# Patient Record
Sex: Female | Born: 1940 | Race: Asian | Hispanic: No | Marital: Married | Smoking: Never smoker
Health system: Southern US, Community
[De-identification: ages and names within clinical notes are randomized; demographics above are authoritative.]

## PROBLEM LIST (undated history)

## (undated) DIAGNOSIS — E785 Hyperlipidemia, unspecified: Secondary | ICD-10-CM

## (undated) DIAGNOSIS — D72819 Decreased white blood cell count, unspecified: Secondary | ICD-10-CM

## (undated) DIAGNOSIS — Z923 Personal history of irradiation: Secondary | ICD-10-CM

## (undated) DIAGNOSIS — I10 Essential (primary) hypertension: Secondary | ICD-10-CM

## (undated) DIAGNOSIS — C50919 Malignant neoplasm of unspecified site of unspecified female breast: Secondary | ICD-10-CM

## (undated) DIAGNOSIS — Z9221 Personal history of antineoplastic chemotherapy: Secondary | ICD-10-CM

## (undated) DIAGNOSIS — F419 Anxiety disorder, unspecified: Secondary | ICD-10-CM

## (undated) HISTORY — PX: BREAST SURGERY: SHX581

## (undated) HISTORY — DX: Malignant neoplasm of unspecified site of unspecified female breast: C50.919

## (undated) HISTORY — DX: Hyperlipidemia, unspecified: E78.5

## (undated) HISTORY — DX: Essential (primary) hypertension: I10

## (undated) HISTORY — DX: Decreased white blood cell count, unspecified: D72.819

## (undated) HISTORY — PX: BREAST BIOPSY: SHX20

## (undated) HISTORY — PX: BREAST EXCISIONAL BIOPSY: SUR124

## (undated) HISTORY — DX: Anxiety disorder, unspecified: F41.9

---

## 1967-08-27 HISTORY — PX: APPENDECTOMY: SHX54

## 1976-06-26 HISTORY — PX: ABDOMINAL HYSTERECTOMY: SHX81

## 1987-08-27 HISTORY — PX: LIPOMA EXCISION: SHX5283

## 1999-01-09 ENCOUNTER — Emergency Department (HOSPITAL_COMMUNITY): Admission: EM | Admit: 1999-01-09 | Discharge: 1999-01-09 | Payer: Self-pay | Admitting: Emergency Medicine

## 1999-08-07 ENCOUNTER — Encounter: Payer: Self-pay | Admitting: Family Medicine

## 1999-08-07 ENCOUNTER — Encounter: Admission: RE | Admit: 1999-08-07 | Discharge: 1999-08-07 | Payer: Self-pay | Admitting: Family Medicine

## 2000-08-07 ENCOUNTER — Encounter: Payer: Self-pay | Admitting: Family Medicine

## 2000-08-07 ENCOUNTER — Encounter: Admission: RE | Admit: 2000-08-07 | Discharge: 2000-08-07 | Payer: Self-pay | Admitting: Family Medicine

## 2000-10-27 ENCOUNTER — Emergency Department (HOSPITAL_COMMUNITY): Admission: EM | Admit: 2000-10-27 | Discharge: 2000-10-27 | Payer: Self-pay | Admitting: Emergency Medicine

## 2001-08-10 ENCOUNTER — Encounter: Admission: RE | Admit: 2001-08-10 | Discharge: 2001-08-10 | Payer: Self-pay | Admitting: Family Medicine

## 2001-08-10 ENCOUNTER — Encounter: Payer: Self-pay | Admitting: Family Medicine

## 2001-08-12 ENCOUNTER — Encounter: Payer: Self-pay | Admitting: Family Medicine

## 2001-08-12 ENCOUNTER — Encounter: Admission: RE | Admit: 2001-08-12 | Discharge: 2001-08-12 | Payer: Self-pay | Admitting: Family Medicine

## 2002-08-13 ENCOUNTER — Encounter: Payer: Self-pay | Admitting: Family Medicine

## 2002-08-13 ENCOUNTER — Encounter: Admission: RE | Admit: 2002-08-13 | Discharge: 2002-08-13 | Payer: Self-pay | Admitting: Family Medicine

## 2002-08-27 ENCOUNTER — Encounter: Payer: Self-pay | Admitting: Family Medicine

## 2002-08-27 ENCOUNTER — Encounter: Admission: RE | Admit: 2002-08-27 | Discharge: 2002-08-27 | Payer: Self-pay | Admitting: Family Medicine

## 2002-08-27 ENCOUNTER — Other Ambulatory Visit: Admission: RE | Admit: 2002-08-27 | Discharge: 2002-08-27 | Payer: Self-pay | Admitting: Diagnostic Radiology

## 2002-08-27 ENCOUNTER — Encounter (INDEPENDENT_AMBULATORY_CARE_PROVIDER_SITE_OTHER): Payer: Self-pay | Admitting: Specialist

## 2002-09-01 ENCOUNTER — Encounter: Payer: Self-pay | Admitting: General Surgery

## 2002-09-01 ENCOUNTER — Encounter: Admission: RE | Admit: 2002-09-01 | Discharge: 2002-09-01 | Payer: Self-pay | Admitting: General Surgery

## 2002-09-02 ENCOUNTER — Encounter: Payer: Self-pay | Admitting: General Surgery

## 2002-09-02 ENCOUNTER — Encounter (INDEPENDENT_AMBULATORY_CARE_PROVIDER_SITE_OTHER): Payer: Self-pay | Admitting: *Deleted

## 2002-09-02 ENCOUNTER — Encounter: Admission: RE | Admit: 2002-09-02 | Discharge: 2002-09-02 | Payer: Self-pay | Admitting: General Surgery

## 2002-09-02 ENCOUNTER — Ambulatory Visit (HOSPITAL_BASED_OUTPATIENT_CLINIC_OR_DEPARTMENT_OTHER): Admission: RE | Admit: 2002-09-02 | Discharge: 2002-09-02 | Payer: Self-pay | Admitting: General Surgery

## 2002-09-02 DIAGNOSIS — C50919 Malignant neoplasm of unspecified site of unspecified female breast: Secondary | ICD-10-CM

## 2002-09-02 HISTORY — PX: BREAST LUMPECTOMY: SHX2

## 2002-09-02 HISTORY — DX: Malignant neoplasm of unspecified site of unspecified female breast: C50.919

## 2002-09-22 ENCOUNTER — Ambulatory Visit: Admission: RE | Admit: 2002-09-22 | Discharge: 2002-10-12 | Payer: Self-pay | Admitting: Radiation Oncology

## 2002-09-27 ENCOUNTER — Encounter: Payer: Self-pay | Admitting: Oncology

## 2002-09-27 ENCOUNTER — Ambulatory Visit (HOSPITAL_COMMUNITY): Admission: RE | Admit: 2002-09-27 | Discharge: 2002-09-27 | Payer: Self-pay | Admitting: Oncology

## 2002-09-30 ENCOUNTER — Ambulatory Visit (HOSPITAL_COMMUNITY): Admission: RE | Admit: 2002-09-30 | Discharge: 2002-09-30 | Payer: Self-pay | Admitting: Oncology

## 2002-09-30 ENCOUNTER — Encounter: Payer: Self-pay | Admitting: Oncology

## 2002-10-04 ENCOUNTER — Other Ambulatory Visit: Admission: RE | Admit: 2002-10-04 | Discharge: 2002-10-04 | Payer: Self-pay | Admitting: Oncology

## 2002-10-04 ENCOUNTER — Encounter (INDEPENDENT_AMBULATORY_CARE_PROVIDER_SITE_OTHER): Payer: Self-pay | Admitting: Specialist

## 2002-10-19 ENCOUNTER — Encounter: Admission: RE | Admit: 2002-10-19 | Discharge: 2002-10-19 | Payer: Self-pay | Admitting: Radiation Oncology

## 2002-11-02 ENCOUNTER — Encounter: Payer: Self-pay | Admitting: Oncology

## 2002-11-02 ENCOUNTER — Ambulatory Visit (HOSPITAL_COMMUNITY): Admission: RE | Admit: 2002-11-02 | Discharge: 2002-11-02 | Payer: Self-pay | Admitting: Oncology

## 2002-12-19 ENCOUNTER — Emergency Department (HOSPITAL_COMMUNITY): Admission: EM | Admit: 2002-12-19 | Discharge: 2002-12-19 | Payer: Self-pay | Admitting: Emergency Medicine

## 2002-12-25 ENCOUNTER — Ambulatory Visit (HOSPITAL_COMMUNITY): Admission: RE | Admit: 2002-12-25 | Discharge: 2002-12-25 | Payer: Self-pay | Admitting: Oncology

## 2002-12-25 ENCOUNTER — Encounter: Payer: Self-pay | Admitting: Oncology

## 2003-01-03 ENCOUNTER — Ambulatory Visit (HOSPITAL_COMMUNITY): Admission: RE | Admit: 2003-01-03 | Discharge: 2003-01-03 | Payer: Self-pay | Admitting: Oncology

## 2003-01-03 ENCOUNTER — Encounter: Payer: Self-pay | Admitting: Oncology

## 2003-02-23 ENCOUNTER — Ambulatory Visit: Admission: RE | Admit: 2003-02-23 | Discharge: 2003-04-19 | Payer: Self-pay | Admitting: Radiation Oncology

## 2003-04-24 ENCOUNTER — Encounter: Payer: Self-pay | Admitting: Emergency Medicine

## 2003-04-24 ENCOUNTER — Emergency Department (HOSPITAL_COMMUNITY): Admission: EM | Admit: 2003-04-24 | Discharge: 2003-04-24 | Payer: Self-pay | Admitting: Emergency Medicine

## 2003-06-21 ENCOUNTER — Encounter: Admission: RE | Admit: 2003-06-21 | Discharge: 2003-06-21 | Payer: Self-pay | Admitting: Oncology

## 2003-08-30 ENCOUNTER — Encounter: Admission: RE | Admit: 2003-08-30 | Discharge: 2003-08-30 | Payer: Self-pay | Admitting: General Surgery

## 2004-04-16 ENCOUNTER — Ambulatory Visit (HOSPITAL_COMMUNITY): Admission: RE | Admit: 2004-04-16 | Discharge: 2004-04-16 | Payer: Self-pay | Admitting: Gastroenterology

## 2004-08-30 ENCOUNTER — Encounter: Admission: RE | Admit: 2004-08-30 | Discharge: 2004-08-30 | Payer: Self-pay | Admitting: General Surgery

## 2004-10-04 ENCOUNTER — Ambulatory Visit: Payer: Self-pay | Admitting: Oncology

## 2005-04-05 ENCOUNTER — Ambulatory Visit: Payer: Self-pay | Admitting: Oncology

## 2005-06-10 ENCOUNTER — Encounter: Admission: RE | Admit: 2005-06-10 | Discharge: 2005-06-10 | Payer: Self-pay | Admitting: Oncology

## 2005-09-02 ENCOUNTER — Encounter: Admission: RE | Admit: 2005-09-02 | Discharge: 2005-09-02 | Payer: Self-pay | Admitting: Oncology

## 2005-09-12 ENCOUNTER — Encounter: Admission: RE | Admit: 2005-09-12 | Discharge: 2005-09-12 | Payer: Self-pay | Admitting: General Surgery

## 2005-09-12 ENCOUNTER — Encounter (INDEPENDENT_AMBULATORY_CARE_PROVIDER_SITE_OTHER): Payer: Self-pay | Admitting: Specialist

## 2005-09-12 ENCOUNTER — Ambulatory Visit (HOSPITAL_BASED_OUTPATIENT_CLINIC_OR_DEPARTMENT_OTHER): Admission: RE | Admit: 2005-09-12 | Discharge: 2005-09-12 | Payer: Self-pay | Admitting: General Surgery

## 2005-10-04 ENCOUNTER — Ambulatory Visit: Payer: Self-pay | Admitting: Oncology

## 2006-03-27 ENCOUNTER — Ambulatory Visit: Payer: Self-pay | Admitting: Oncology

## 2006-03-31 LAB — CBC WITH DIFFERENTIAL/PLATELET
BASO%: 0.6 % (ref 0.0–2.0)
Basophils Absolute: 0 10*3/uL (ref 0.0–0.1)
EOS%: 1.8 % (ref 0.0–7.0)
HCT: 33.7 % — ABNORMAL LOW (ref 34.8–46.6)
HGB: 11.9 g/dL (ref 11.6–15.9)
MCH: 32.5 pg (ref 26.0–34.0)
MONO#: 0.3 10*3/uL (ref 0.1–0.9)
RDW: 13.8 % (ref 11.3–14.5)
WBC: 3.5 10*3/uL — ABNORMAL LOW (ref 3.9–10.0)
lymph#: 1 10*3/uL (ref 0.9–3.3)

## 2006-03-31 LAB — COMPREHENSIVE METABOLIC PANEL
ALT: 38 U/L (ref 0–40)
AST: 32 U/L (ref 0–37)
Albumin: 4.3 g/dL (ref 3.5–5.2)
Alkaline Phosphatase: 35 U/L — ABNORMAL LOW (ref 39–117)
Potassium: 4.4 mEq/L (ref 3.5–5.3)
Sodium: 131 mEq/L — ABNORMAL LOW (ref 135–145)
Total Bilirubin: 0.4 mg/dL (ref 0.3–1.2)
Total Protein: 6.6 g/dL (ref 6.0–8.3)

## 2006-03-31 LAB — LACTATE DEHYDROGENASE: LDH: 152 U/L (ref 94–250)

## 2006-04-15 LAB — BASIC METABOLIC PANEL
BUN: 12 mg/dL (ref 6–23)
CO2: 30 mEq/L (ref 19–32)
Chloride: 94 mEq/L — ABNORMAL LOW (ref 96–112)
Creatinine, Ser: 0.78 mg/dL (ref 0.40–1.20)
Glucose, Bld: 99 mg/dL (ref 70–99)

## 2006-09-03 ENCOUNTER — Ambulatory Visit (HOSPITAL_COMMUNITY): Admission: RE | Admit: 2006-09-03 | Discharge: 2006-09-03 | Payer: Self-pay | Admitting: Oncology

## 2006-09-03 ENCOUNTER — Encounter: Admission: RE | Admit: 2006-09-03 | Discharge: 2006-09-03 | Payer: Self-pay | Admitting: Oncology

## 2006-09-08 ENCOUNTER — Encounter: Admission: RE | Admit: 2006-09-08 | Discharge: 2006-09-08 | Payer: Self-pay | Admitting: Oncology

## 2006-09-24 ENCOUNTER — Ambulatory Visit: Payer: Self-pay | Admitting: Oncology

## 2006-09-29 LAB — CBC WITH DIFFERENTIAL/PLATELET
BASO%: 0.5 % (ref 0.0–2.0)
Basophils Absolute: 0 10*3/uL (ref 0.0–0.1)
EOS%: 2.2 % (ref 0.0–7.0)
HGB: 12.3 g/dL (ref 11.6–15.9)
MCH: 32.2 pg (ref 26.0–34.0)
MCHC: 35.5 g/dL (ref 32.0–36.0)
MCV: 90.8 fL (ref 81.0–101.0)
MONO%: 11.2 % (ref 0.0–13.0)
RDW: 13.8 % (ref 11.3–14.5)

## 2006-09-29 LAB — COMPREHENSIVE METABOLIC PANEL
ALT: 39 U/L — ABNORMAL HIGH (ref 0–35)
AST: 35 U/L (ref 0–37)
Albumin: 4.2 g/dL (ref 3.5–5.2)
Alkaline Phosphatase: 35 U/L — ABNORMAL LOW (ref 39–117)
BUN: 18 mg/dL (ref 6–23)
Creatinine, Ser: 0.83 mg/dL (ref 0.40–1.20)
Potassium: 4.1 mEq/L (ref 3.5–5.3)

## 2006-09-29 LAB — LACTATE DEHYDROGENASE: LDH: 142 U/L (ref 94–250)

## 2007-03-09 ENCOUNTER — Ambulatory Visit (HOSPITAL_COMMUNITY): Admission: RE | Admit: 2007-03-09 | Discharge: 2007-03-09 | Payer: Self-pay | Admitting: Oncology

## 2007-03-11 ENCOUNTER — Encounter: Admission: RE | Admit: 2007-03-11 | Discharge: 2007-03-11 | Payer: Self-pay | Admitting: Oncology

## 2007-03-26 ENCOUNTER — Ambulatory Visit: Payer: Self-pay | Admitting: Oncology

## 2007-03-30 LAB — CBC WITH DIFFERENTIAL/PLATELET
Basophils Absolute: 0 10*3/uL (ref 0.0–0.1)
Eosinophils Absolute: 0 10*3/uL (ref 0.0–0.5)
HGB: 12.4 g/dL (ref 11.6–15.9)
MCV: 90.9 fL (ref 81.0–101.0)
MONO#: 0.4 10*3/uL (ref 0.1–0.9)
NEUT#: 2 10*3/uL (ref 1.5–6.5)
RBC: 3.86 10*6/uL (ref 3.70–5.32)
RDW: 13.8 % (ref 11.3–14.5)
WBC: 4 10*3/uL (ref 3.9–10.0)

## 2007-03-30 LAB — COMPREHENSIVE METABOLIC PANEL
Albumin: 4.3 g/dL (ref 3.5–5.2)
BUN: 18 mg/dL (ref 6–23)
CO2: 26 mEq/L (ref 19–32)
Calcium: 9.2 mg/dL (ref 8.4–10.5)
Chloride: 99 mEq/L (ref 96–112)
Glucose, Bld: 82 mg/dL (ref 70–99)
Potassium: 4.5 mEq/L (ref 3.5–5.3)
Sodium: 135 mEq/L (ref 135–145)
Total Protein: 7 g/dL (ref 6.0–8.3)

## 2007-03-30 LAB — LACTATE DEHYDROGENASE: LDH: 159 U/L (ref 94–250)

## 2007-03-30 LAB — CANCER ANTIGEN 27.29: CA 27.29: 5 U/mL (ref 0–39)

## 2007-06-12 ENCOUNTER — Encounter: Admission: RE | Admit: 2007-06-12 | Discharge: 2007-06-12 | Payer: Self-pay | Admitting: Oncology

## 2007-09-07 ENCOUNTER — Encounter: Admission: RE | Admit: 2007-09-07 | Discharge: 2007-09-07 | Payer: Self-pay | Admitting: Oncology

## 2007-09-28 ENCOUNTER — Ambulatory Visit: Payer: Self-pay | Admitting: Oncology

## 2007-09-30 LAB — CBC WITH DIFFERENTIAL/PLATELET
BASO%: 0.7 % (ref 0.0–2.0)
Eosinophils Absolute: 0.1 10*3/uL (ref 0.0–0.5)
HCT: 33.1 % — ABNORMAL LOW (ref 34.8–46.6)
LYMPH%: 32.2 % (ref 14.0–48.0)
MONO#: 0.3 10*3/uL (ref 0.1–0.9)
NEUT#: 1.6 10*3/uL (ref 1.5–6.5)
NEUT%: 55.3 % (ref 39.6–76.8)
Platelets: 176 10*3/uL (ref 145–400)
WBC: 3 10*3/uL — ABNORMAL LOW (ref 3.9–10.0)
lymph#: 1 10*3/uL (ref 0.9–3.3)

## 2007-10-01 LAB — COMPREHENSIVE METABOLIC PANEL
ALT: 30 U/L (ref 0–35)
CO2: 28 mEq/L (ref 19–32)
Calcium: 8.7 mg/dL (ref 8.4–10.5)
Chloride: 98 mEq/L (ref 96–112)
Creatinine, Ser: 0.75 mg/dL (ref 0.40–1.20)
Glucose, Bld: 108 mg/dL — ABNORMAL HIGH (ref 70–99)
Sodium: 133 mEq/L — ABNORMAL LOW (ref 135–145)
Total Bilirubin: 0.3 mg/dL (ref 0.3–1.2)
Total Protein: 6.6 g/dL (ref 6.0–8.3)

## 2007-10-01 LAB — VITAMIN D 25 HYDROXY (VIT D DEFICIENCY, FRACTURES): Vit D, 25-Hydroxy: 40 ng/mL (ref 30–89)

## 2007-10-05 LAB — VITAMIN D 1,25 DIHYDROXY: Vit D, 1,25-Dihydroxy: 61 pg/mL (ref 6–62)

## 2008-03-26 ENCOUNTER — Ambulatory Visit: Payer: Self-pay | Admitting: Oncology

## 2008-03-29 LAB — CBC WITH DIFFERENTIAL/PLATELET
Basophils Absolute: 0 10*3/uL (ref 0.0–0.1)
EOS%: 1 % (ref 0.0–7.0)
Eosinophils Absolute: 0 10*3/uL (ref 0.0–0.5)
HGB: 11.6 g/dL (ref 11.6–15.9)
NEUT#: 2.4 10*3/uL (ref 1.5–6.5)
RBC: 3.62 10*6/uL — ABNORMAL LOW (ref 3.70–5.32)
RDW: 13.4 % (ref 11.3–14.5)
lymph#: 1.1 10*3/uL (ref 0.9–3.3)

## 2008-03-30 LAB — COMPREHENSIVE METABOLIC PANEL
ALT: 25 U/L (ref 0–35)
AST: 30 U/L (ref 0–37)
Alkaline Phosphatase: 33 U/L — ABNORMAL LOW (ref 39–117)
BUN: 18 mg/dL (ref 6–23)
Creatinine, Ser: 0.83 mg/dL (ref 0.40–1.20)
Total Bilirubin: 0.4 mg/dL (ref 0.3–1.2)

## 2008-03-30 LAB — VITAMIN D 25 HYDROXY (VIT D DEFICIENCY, FRACTURES): Vit D, 25-Hydroxy: 39 ng/mL (ref 30–89)

## 2008-09-07 ENCOUNTER — Encounter: Admission: RE | Admit: 2008-09-07 | Discharge: 2008-09-07 | Payer: Self-pay | Admitting: Oncology

## 2008-10-25 ENCOUNTER — Ambulatory Visit: Payer: Self-pay | Admitting: Oncology

## 2008-10-27 LAB — CBC WITH DIFFERENTIAL/PLATELET
Basophils Absolute: 0 10*3/uL (ref 0.0–0.1)
EOS%: 1.3 % (ref 0.0–7.0)
Eosinophils Absolute: 0 10*3/uL (ref 0.0–0.5)
HGB: 11.9 g/dL (ref 11.6–15.9)
MCH: 31.9 pg (ref 25.1–34.0)
MONO%: 6.4 % (ref 0.0–14.0)
NEUT#: 2.4 10*3/uL (ref 1.5–6.5)
RBC: 3.72 10*6/uL (ref 3.70–5.45)
RDW: 13.6 % (ref 11.2–14.5)
lymph#: 1.2 10*3/uL (ref 0.9–3.3)

## 2008-10-28 LAB — COMPREHENSIVE METABOLIC PANEL
Alkaline Phosphatase: 33 U/L — ABNORMAL LOW (ref 39–117)
BUN: 16 mg/dL (ref 6–23)
Creatinine, Ser: 0.75 mg/dL (ref 0.40–1.20)
Glucose, Bld: 110 mg/dL — ABNORMAL HIGH (ref 70–99)
Total Bilirubin: 0.3 mg/dL (ref 0.3–1.2)

## 2008-10-28 LAB — VITAMIN D 25 HYDROXY (VIT D DEFICIENCY, FRACTURES): Vit D, 25-Hydroxy: 48 ng/mL (ref 30–89)

## 2008-10-28 LAB — CANCER ANTIGEN 27.29: CA 27.29: 13 U/mL (ref 0–39)

## 2009-04-28 ENCOUNTER — Ambulatory Visit: Payer: Self-pay | Admitting: Oncology

## 2009-05-03 LAB — CBC WITH DIFFERENTIAL/PLATELET
Basophils Absolute: 0 10*3/uL (ref 0.0–0.1)
Eosinophils Absolute: 0 10*3/uL (ref 0.0–0.5)
HCT: 33.2 % — ABNORMAL LOW (ref 34.8–46.6)
HGB: 11.6 g/dL (ref 11.6–15.9)
LYMPH%: 20.8 % (ref 14.0–49.7)
MCV: 91.5 fL (ref 79.5–101.0)
MONO%: 5.9 % (ref 0.0–14.0)
NEUT#: 2.8 10*3/uL (ref 1.5–6.5)
NEUT%: 71.6 % (ref 38.4–76.8)
Platelets: 155 10*3/uL (ref 145–400)

## 2009-05-04 LAB — COMPREHENSIVE METABOLIC PANEL
Alkaline Phosphatase: 33 U/L — ABNORMAL LOW (ref 39–117)
BUN: 15 mg/dL (ref 6–23)
CO2: 28 mEq/L (ref 19–32)
Glucose, Bld: 182 mg/dL — ABNORMAL HIGH (ref 70–99)
Total Bilirubin: 0.4 mg/dL (ref 0.3–1.2)

## 2009-05-04 LAB — VITAMIN D 25 HYDROXY (VIT D DEFICIENCY, FRACTURES): Vit D, 25-Hydroxy: 45 ng/mL (ref 30–89)

## 2009-05-04 LAB — LACTATE DEHYDROGENASE: LDH: 161 U/L (ref 94–250)

## 2009-05-04 LAB — CANCER ANTIGEN 27.29: CA 27.29: 7 U/mL (ref 0–39)

## 2009-06-03 IMAGING — MG MM DIAGNOSTIC BILATERAL
7 series · 7 of 7 positions shown · non-contrast
Comparison: Prior studies.

DG DIAGNOSTIC BILATERAL
Bilateral CC and MLO view(s) were taken.

DIGITAL BILATERAL  DIAGNOSTIC MAMMOGRAM WITH MAMMOGRAM:
CLINICAL DATA: Left breast lumpectomy in 0888.  Reevaluation of the lumpectomy site as well as 
probably benign right breast calcifications.

[R CC (1 of 2)]
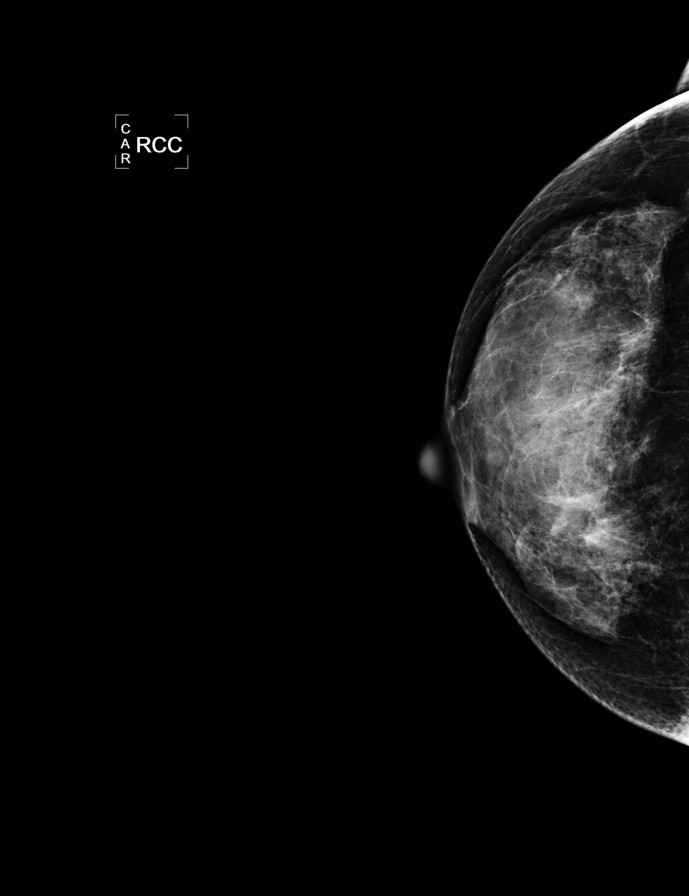

[L CC]
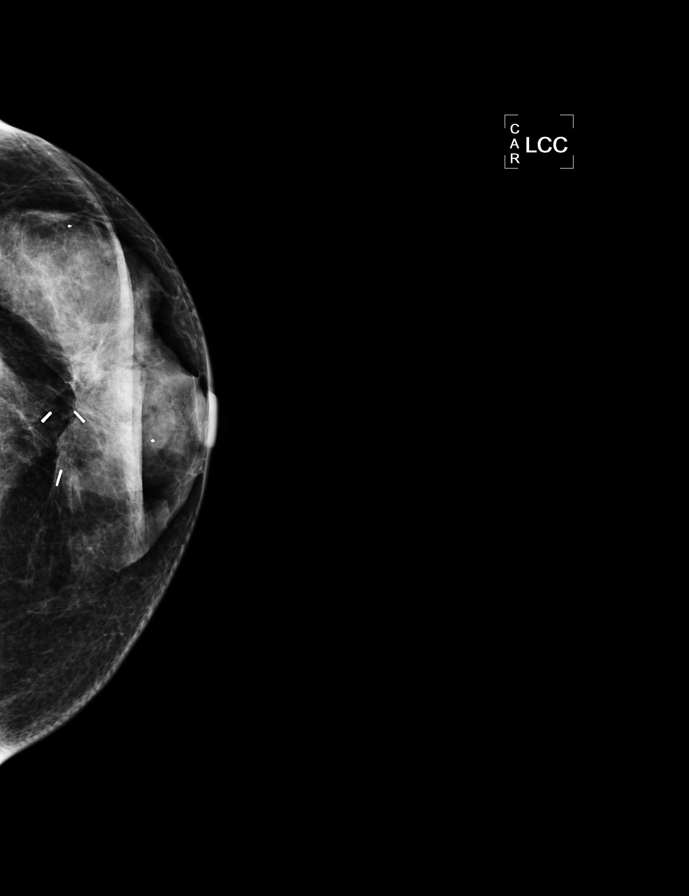

[L MLO]
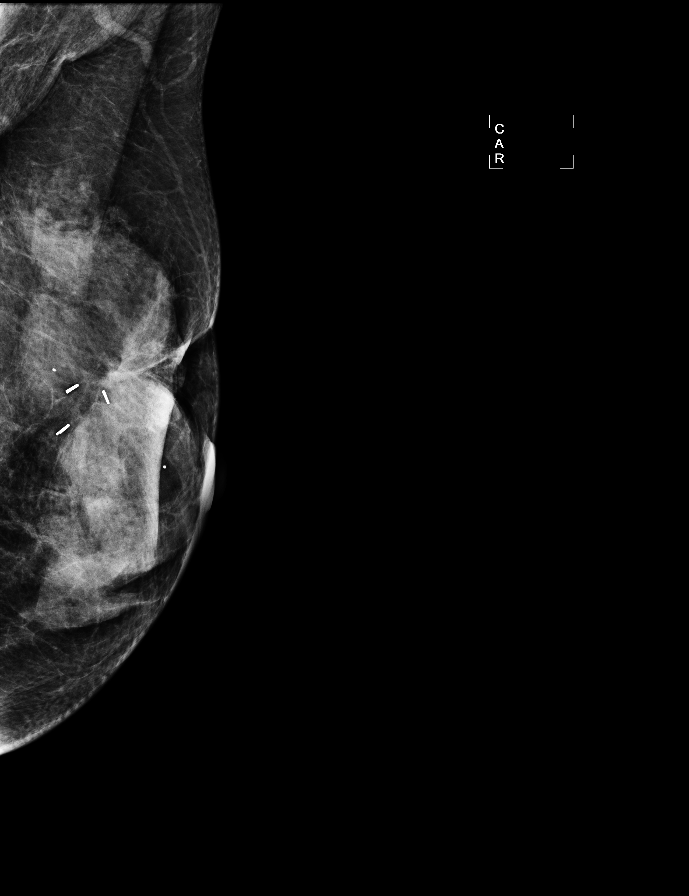

[R MLO]
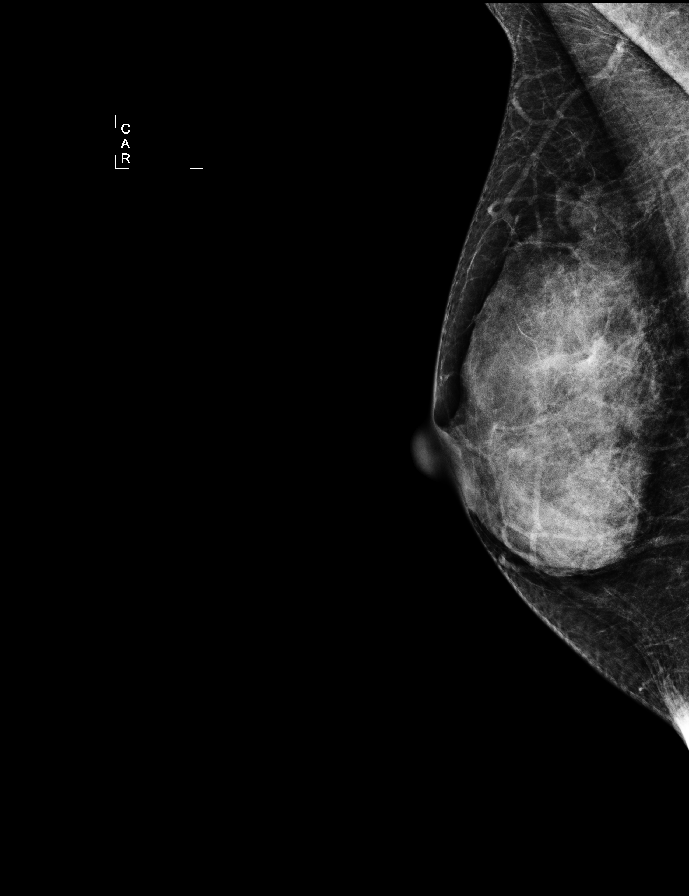

[L TAN]
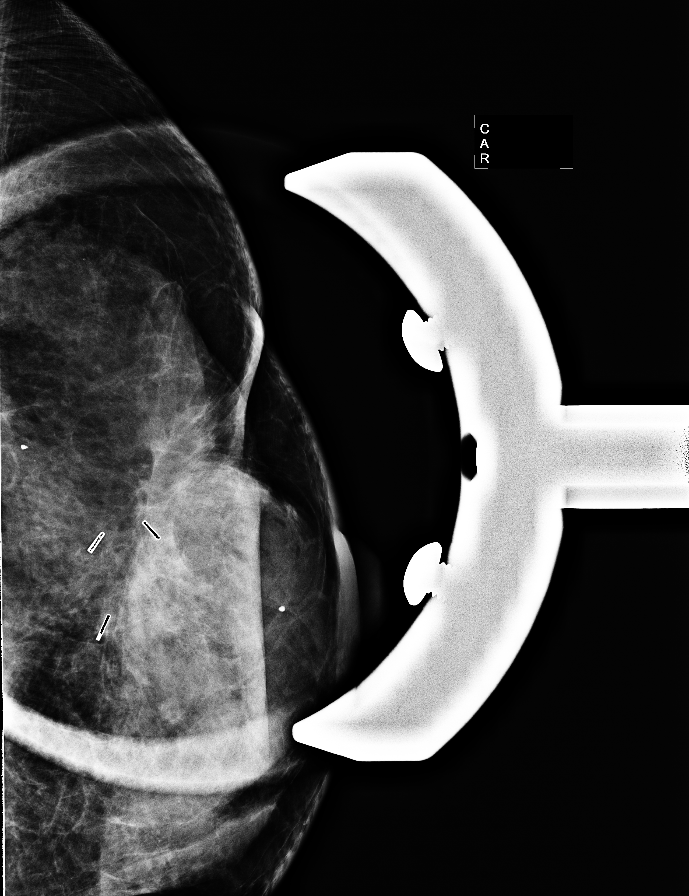

[R CC (2 of 2)]
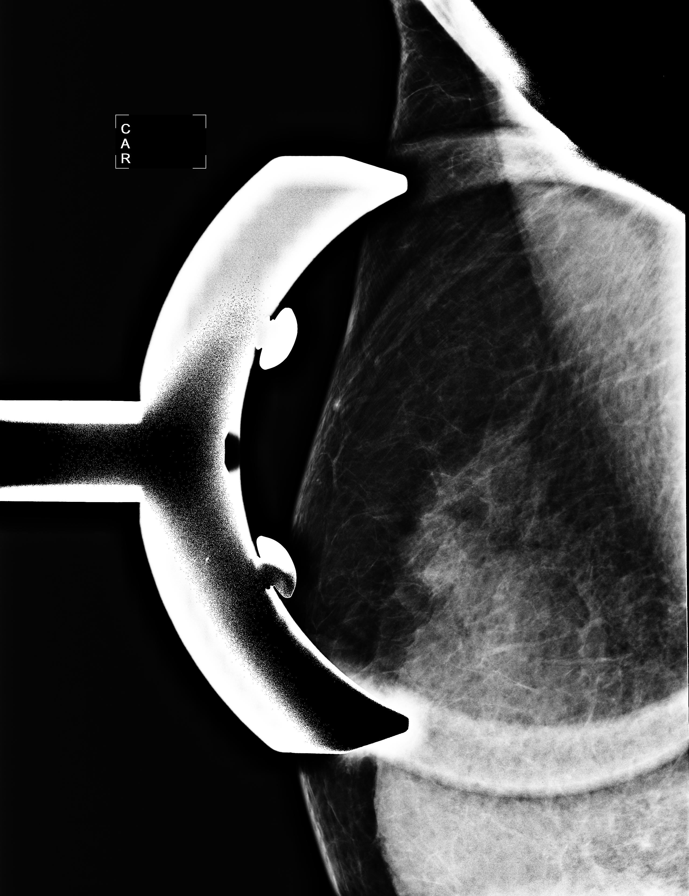

[R ML]
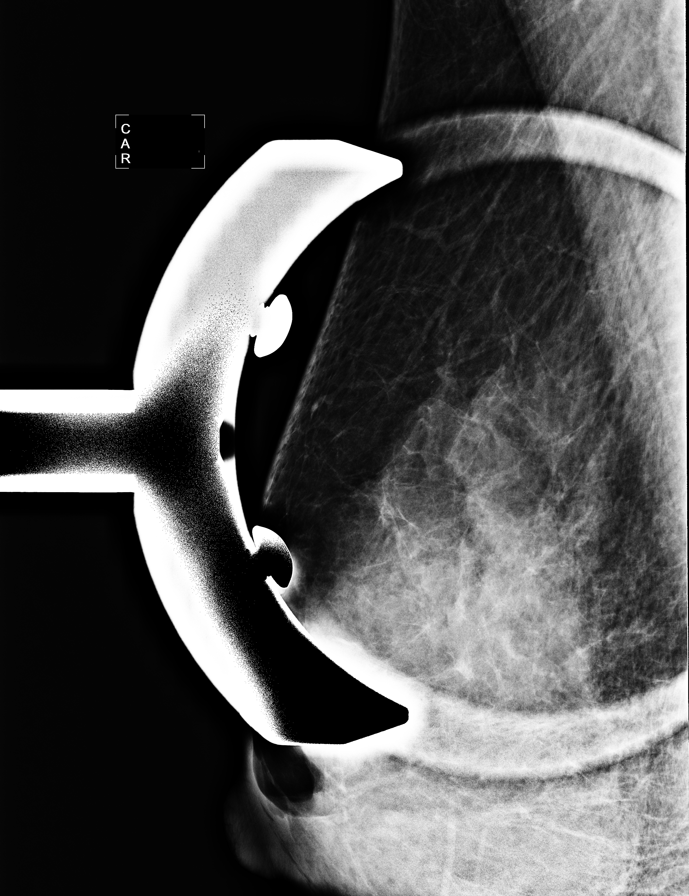

[7 of 7 positions shown; findings below may reference images not displayed]

There are stable scarring changes noted superiorly within the left breast.  There are stable 
benign-appearing calcifications located superiorly within the right breast.  There is an extremely 
dense breast parenchymal pattern.  There is no specific evidence for malignancy within either 
breast.  The patient was recommended for six-month follow-up breast MRI during the 03-09-07 
bilateral breast MRI study and I would recommend follow-up breast MRI in the next month.
IMPRESSION: Stable breast parenchymal pattern and stable benign-appearing calcifications within the right 
breast.  No specific evidence for malignancy.  Recommend yearly diagnostic mammography as well as 
bilateral breast MRI at this time.

ASSESSMENT: Need additional imaging evaluation and/or prior mammograms for comparison - BI-RADS 0

Breast MRI of both breasts.
, THIS PROCEDURE WAS A DIGITAL MAMMOGRAM

## 2009-06-12 ENCOUNTER — Encounter: Admission: RE | Admit: 2009-06-12 | Discharge: 2009-06-12 | Payer: Self-pay | Admitting: Oncology

## 2009-09-08 ENCOUNTER — Encounter: Admission: RE | Admit: 2009-09-08 | Discharge: 2009-09-08 | Payer: Self-pay | Admitting: Oncology

## 2010-05-03 ENCOUNTER — Ambulatory Visit: Payer: Self-pay | Admitting: Oncology

## 2010-05-03 LAB — CBC WITH DIFFERENTIAL/PLATELET
BASO%: 0.6 % (ref 0.0–2.0)
Eosinophils Absolute: 0.1 10*3/uL (ref 0.0–0.5)
LYMPH%: 25.5 % (ref 14.0–49.7)
MCHC: 34.5 g/dL (ref 31.5–36.0)
MONO#: 0.3 10*3/uL (ref 0.1–0.9)
NEUT#: 2.4 10*3/uL (ref 1.5–6.5)
Platelets: 150 10*3/uL (ref 145–400)
RBC: 3.71 10*6/uL (ref 3.70–5.45)
WBC: 3.7 10*3/uL — ABNORMAL LOW (ref 3.9–10.3)
lymph#: 0.9 10*3/uL (ref 0.9–3.3)

## 2010-05-03 LAB — COMPREHENSIVE METABOLIC PANEL
ALT: 22 U/L (ref 0–35)
Albumin: 4.1 g/dL (ref 3.5–5.2)
CO2: 27 mEq/L (ref 19–32)
Calcium: 8.8 mg/dL (ref 8.4–10.5)
Chloride: 98 mEq/L (ref 96–112)
Glucose, Bld: 106 mg/dL — ABNORMAL HIGH (ref 70–99)
Potassium: 4.3 mEq/L (ref 3.5–5.3)
Sodium: 134 mEq/L — ABNORMAL LOW (ref 135–145)
Total Bilirubin: 0.4 mg/dL (ref 0.3–1.2)
Total Protein: 6.1 g/dL (ref 6.0–8.3)

## 2010-05-03 LAB — CANCER ANTIGEN 27.29: CA 27.29: 7 U/mL (ref 0–39)

## 2010-05-03 LAB — LACTATE DEHYDROGENASE: LDH: 147 U/L (ref 94–250)

## 2010-09-10 ENCOUNTER — Encounter
Admission: RE | Admit: 2010-09-10 | Discharge: 2010-09-10 | Payer: Self-pay | Source: Home / Self Care | Attending: Oncology | Admitting: Oncology

## 2010-11-27 ENCOUNTER — Other Ambulatory Visit: Payer: Self-pay | Admitting: Oncology

## 2010-11-27 DIAGNOSIS — C50919 Malignant neoplasm of unspecified site of unspecified female breast: Secondary | ICD-10-CM

## 2011-01-11 NOTE — Op Note (Signed)
NAMEMARGRETTA, [name]               ACCOUNT NO.:  [id number]   MEDICAL RECORD NO.:  [id number]          PATIENT TYPE:  AMB   LOCATION:  DSC                          FACILITY:  [facility]   PHYSICIAN:  Ollen Gross. Vernell Morgans, M.D. DATE OF BIRTH:  [birth year]   DATE OF PROCEDURE:  09/16/2005  DATE OF DISCHARGE:  09/12/2005                                 OPERATIVE REPORT   PREOPERATIVE DIAGNOSIS:  Left breast calcifications.   POSTOP DIAGNOSIS:  Left breast calcifications.   PROCEDURE:  Left breast needle localized biopsy.   SURGEON:  Dr. Carolynne Edouard.   ANESTHESIA:  General via LMA.   PROCEDURE:  After informed consent was obtained, the patient brought to the  operating room and placed in supine position on the operating table. After  induction of general anesthesia, the patient's left breast and chest were  prepped with Betadine and draped in usual sterile manner. The patient had  previously had wire placed near the calcifications for localization  purposes. A small curvilinear incision was made along the upper portion of  the breast beneath the entry site of the wire. This incision was carried  down through the skin and into the subcutaneous fatty breast tissue. A  circular specimen of fatty tissue surrounding the path of the wire was  removed sharply with electrocautery. Once this was done circumferentially  around the wire, the specimen was removed from the patient and oriented with  a short stitch being superior, long stitch being lateral and double stitch  being at the skin and the specimen was then x-rayed and the calcifications  were noted to be within the specimen. The wound was then examined and  hemostasis was achieved using Bovie electrocautery. The wound was then  irrigated copious amounts of saline and the skin was closed with interrupted  for Monocryl subcuticular stitches. Benzoin, Steri-Strips and sterile  dressings were applied. The patient tolerated well.  At the end of the case,  all needle, sponge and instrument counts correct. The patient was awakened  and taken recovery in stable condition.      Ollen Gross. Vernell Morgans, M.D.  Electronically Signed     PST/MEDQ  D:  09/16/2005  T:  09/17/2005  Job:  [id number]

## 2011-01-11 NOTE — Consult Note (Signed)
   Maureen [name], SOBH                           ACCOUNT NO.:  [id number]   MEDICAL RECORD NO.:  [id number]                   PATIENT TYPE:  EMS   LOCATION:  ED                                   FACILITY:  [facility]   PHYSICIAN:  Lennis P. Darrold Span, M.D.             DATE OF BIRTH:  [birth year]   DATE OF CONSULTATION:  12/19/2002  DATE OF DISCHARGE:                                   CONSULTATION   HISTORY OF PRESENT ILLNESS:  The patient is a 70 year old lady being treated  with adjuvant chemotherapy for breast cancer by Dr. Pierce Crane, seen in the  [facility] emergency room with a temperature earlier this  afternoon of 100.9 and a history of previous neutropenia despite Neulasta  with the last cycle of her chemotherapy. The patient was treated with  chemotherapy on December 13, 2002 and received Neulasta, I believe, on December 14, 2002. She had no localizing symptoms of infection with the temperature,  no shaking chills, and does not have a central catheter. She has not had any  bleeding. She has mostly felt tired today. No significant nausea.   PHYSICAL EXAMINATION:  GENERAL: She is ambulatory without assistance. In no  acute distress.  HEENT:  Oropharynx is clear. Mucous membranes are moist.  LUNGS: Clear.  HEART: Regular rate and rhythm.  ABDOMEN: Soft and nontender.  EXTREMITIES: Lower extremities without edema.  SKIN: No petechia.   LABORATORY DATA:  Hemoglobin 11.6, ANC 1.3, white count 1.5, platelets  57,000.   ALLERGIES:  PENICILLIN AND SEPTRA.   IMPRESSION/PLAN:  1. Counts dropping but not yet neutropenic, post adjuvant chemotherapy for     breast cancer, despite Neulasta. Will begin Cipro 250 mg by mouth twice a     day. They are to call the office in the morning on December 20, 2002 and I     imagine, will needs labs again in the next few days. Her daughter tells     me that she has a baseline low white count, however, I do not have     prior's for  comparison tonight.                                               Lennis P. Darrold Span, M.D.    LPL/MEDQ  D:  12/19/2002  T:  12/19/2002  Job:  [id number]   cc:   Pierce Crane, M.D.  [address] [facility]  [place]  [place] [address]  Fax: [phone]

## 2011-05-03 ENCOUNTER — Encounter (HOSPITAL_BASED_OUTPATIENT_CLINIC_OR_DEPARTMENT_OTHER): Payer: Medicare Other | Admitting: Oncology

## 2011-05-03 ENCOUNTER — Other Ambulatory Visit: Payer: Self-pay | Admitting: Oncology

## 2011-05-03 DIAGNOSIS — M810 Age-related osteoporosis without current pathological fracture: Secondary | ICD-10-CM

## 2011-05-03 DIAGNOSIS — C50219 Malignant neoplasm of upper-inner quadrant of unspecified female breast: Secondary | ICD-10-CM

## 2011-05-03 LAB — COMPREHENSIVE METABOLIC PANEL
ALT: 18 U/L (ref 0–35)
CO2: 27 mEq/L (ref 19–32)
Calcium: 8.8 mg/dL (ref 8.4–10.5)
Chloride: 95 mEq/L — ABNORMAL LOW (ref 96–112)
Creatinine, Ser: 0.86 mg/dL (ref 0.50–1.10)
Sodium: 130 mEq/L — ABNORMAL LOW (ref 135–145)
Total Protein: 6.7 g/dL (ref 6.0–8.3)

## 2011-05-03 LAB — CBC WITH DIFFERENTIAL/PLATELET
BASO%: 0.4 % (ref 0.0–2.0)
Eosinophils Absolute: 0 10*3/uL (ref 0.0–0.5)
HCT: 32.8 % — ABNORMAL LOW (ref 34.8–46.6)
MCHC: 34.8 g/dL (ref 31.5–36.0)
MONO#: 0.3 10*3/uL (ref 0.1–0.9)
NEUT#: 2.7 10*3/uL (ref 1.5–6.5)
NEUT%: 64.7 % (ref 38.4–76.8)
WBC: 4.2 10*3/uL (ref 3.9–10.3)
lymph#: 1.1 10*3/uL (ref 0.9–3.3)

## 2011-05-03 LAB — CANCER ANTIGEN 27.29: CA 27.29: 8 U/mL (ref 0–39)

## 2011-05-10 ENCOUNTER — Other Ambulatory Visit: Payer: Self-pay | Admitting: Oncology

## 2011-05-10 ENCOUNTER — Encounter (HOSPITAL_BASED_OUTPATIENT_CLINIC_OR_DEPARTMENT_OTHER): Payer: Medicare Other | Admitting: Oncology

## 2011-05-10 DIAGNOSIS — Z1231 Encounter for screening mammogram for malignant neoplasm of breast: Secondary | ICD-10-CM

## 2011-05-10 DIAGNOSIS — M810 Age-related osteoporosis without current pathological fracture: Secondary | ICD-10-CM

## 2011-05-10 DIAGNOSIS — C50219 Malignant neoplasm of upper-inner quadrant of unspecified female breast: Secondary | ICD-10-CM

## 2011-06-07 ENCOUNTER — Encounter: Payer: Self-pay | Admitting: *Deleted

## 2011-06-11 LAB — CREATININE, SERUM: Creatinine, Ser: 0.73

## 2011-06-11 LAB — BUN: BUN: 10

## 2011-06-14 ENCOUNTER — Ambulatory Visit
Admission: RE | Admit: 2011-06-14 | Discharge: 2011-06-14 | Disposition: A | Discharge status: Home / Self Care | Payer: Medicare Other | Source: Ambulatory Visit | Attending: Oncology | Admitting: Oncology

## 2011-06-14 DIAGNOSIS — C50919 Malignant neoplasm of unspecified site of unspecified female breast: Secondary | ICD-10-CM

## 2011-06-27 ENCOUNTER — Encounter: Payer: Self-pay | Admitting: *Deleted

## 2011-06-27 ENCOUNTER — Other Ambulatory Visit: Payer: Self-pay | Admitting: *Deleted

## 2011-06-27 DIAGNOSIS — C50919 Malignant neoplasm of unspecified site of unspecified female breast: Secondary | ICD-10-CM | POA: Insufficient documentation

## 2011-09-12 ENCOUNTER — Ambulatory Visit
Admission: RE | Admit: 2011-09-12 | Discharge: 2011-09-12 | Disposition: A | Discharge status: Home / Self Care | Payer: Medicare Other | Source: Ambulatory Visit | Attending: Oncology | Admitting: Oncology

## 2011-09-12 DIAGNOSIS — Z1231 Encounter for screening mammogram for malignant neoplasm of breast: Secondary | ICD-10-CM

## 2011-11-11 ENCOUNTER — Other Ambulatory Visit: Payer: Self-pay | Admitting: *Deleted

## 2011-11-14 ENCOUNTER — Ambulatory Visit: Payer: Medicare Other | Admitting: *Deleted

## 2011-11-14 ENCOUNTER — Other Ambulatory Visit: Payer: Self-pay | Admitting: Oncology

## 2011-11-14 DIAGNOSIS — C50919 Malignant neoplasm of unspecified site of unspecified female breast: Secondary | ICD-10-CM

## 2011-11-14 MED ORDER — LETROZOLE/PLACEBO 2.5MG TABS #110 NSABP B-42: 2.5000 mg | ORAL_TABLET | Freq: Every day | ORAL | Status: DC

## 2011-11-14 NOTE — Progress Notes (Signed)
11/14/11 at 11:48am - NSABP B-42 month 42 study notes.  The pt and her daughter were in the office today for her month 49 pt status update.  She returned all of her study drug bottles (bottles 12, 13, and 14).  Bottle 12 had 20 pills remaining.  Bottle 13 had 19 remaining pills and bottle 14 had 23 remaining pills inside.  All of the pills were counted by the pharmacist and discarded.  The pt said that she took "100%" of her study drug.  The pt was dispensed 2 new bottles (bottles 15 and 16) for self administration.  The pt said that she has tolerated her study drug well.  She denies any hospitalizations and broken bones.  Her latest bone density was in Oct. 2012.  Dr. Donnie Coffin reviewed the bone density and stated that it has shown improvement.  He said that he wanted to continue study drug for now.  The pt's daughter asked if her mother should continue the Fosamax.  Rn is awaiting an answer from Dr. Donnie Coffin.  The research nurse faxed Dr. Selena Batten a copy of the pt's latest bone density and her latest mammogram.  Her mammogram was negative for recurrence.  The pt has a scheduled appointment with her in April. The research nurse will request Dr. Mickie Kay office notes for continuity of care.   The pt denies any new adverse events.  She does report some mild (grade 1) right wrist pain.  Arthralgia.  The pt said that she is able to do all of her usual activities, but there are some movements that are painful at times.  Rn encouraged the pt to discuss this issue with her primary care physician.  Rn told the pt that if Dr. Uvaldo Rising wants Dr. Donnie Coffin to see her to discuss the study drug then to contact the research nurse.  Rn said that it is hard to tell if it is study drug related since she may be receiving placebo.  The pt said that she will call Dr. Donnie Coffin if it worsens or if Dr. Uvaldo Rising wants her to discuss the status of her study drug with Dr. Donnie Coffin.  Dr. Donnie Coffin was made aware of her right wrist pain.  He wanted her to discuss  it with Dr. Uvaldo Rising.  The pt is aware of her future appts.   11/15/11 at 2:41pm- Rn spoke to Dr. Donnie Coffin about Ms. Madkins's Fosamax.  Dr. Donnie Coffin was informed that he prescribed the Fosamax for her in November 2004 when she initiated her AI therapy.  Research nurse told MD that a copy of the pt's latest bone density was faxed to Dr. Uvaldo Rising.   Dr. Donnie Coffin was undecided, and he wanted the pt to see her primary care physician on 11/27/11.

## 2011-11-21 DIAGNOSIS — F411 Generalized anxiety disorder: Secondary | ICD-10-CM | POA: Diagnosis not present

## 2011-11-21 DIAGNOSIS — G44209 Tension-type headache, unspecified, not intractable: Secondary | ICD-10-CM | POA: Diagnosis not present

## 2011-11-21 DIAGNOSIS — E039 Hypothyroidism, unspecified: Secondary | ICD-10-CM | POA: Diagnosis not present

## 2011-11-21 DIAGNOSIS — I10 Essential (primary) hypertension: Secondary | ICD-10-CM | POA: Diagnosis not present

## 2011-11-21 DIAGNOSIS — M810 Age-related osteoporosis without current pathological fracture: Secondary | ICD-10-CM | POA: Diagnosis not present

## 2011-11-21 DIAGNOSIS — E7800 Pure hypercholesterolemia, unspecified: Secondary | ICD-10-CM | POA: Diagnosis not present

## 2011-11-21 DIAGNOSIS — R7301 Impaired fasting glucose: Secondary | ICD-10-CM | POA: Diagnosis not present

## 2011-11-21 DIAGNOSIS — Z1211 Encounter for screening for malignant neoplasm of colon: Secondary | ICD-10-CM | POA: Diagnosis not present

## 2011-11-27 DIAGNOSIS — M65839 Other synovitis and tenosynovitis, unspecified forearm: Secondary | ICD-10-CM | POA: Diagnosis not present

## 2011-11-27 DIAGNOSIS — M810 Age-related osteoporosis without current pathological fracture: Secondary | ICD-10-CM | POA: Diagnosis not present

## 2011-11-27 DIAGNOSIS — E7800 Pure hypercholesterolemia, unspecified: Secondary | ICD-10-CM | POA: Diagnosis not present

## 2011-11-27 DIAGNOSIS — R7301 Impaired fasting glucose: Secondary | ICD-10-CM | POA: Diagnosis not present

## 2011-11-27 DIAGNOSIS — I10 Essential (primary) hypertension: Secondary | ICD-10-CM | POA: Diagnosis not present

## 2011-11-27 DIAGNOSIS — M65849 Other synovitis and tenosynovitis, unspecified hand: Secondary | ICD-10-CM | POA: Diagnosis not present

## 2011-11-27 DIAGNOSIS — E039 Hypothyroidism, unspecified: Secondary | ICD-10-CM | POA: Diagnosis not present

## 2011-11-27 DIAGNOSIS — F411 Generalized anxiety disorder: Secondary | ICD-10-CM | POA: Diagnosis not present

## 2011-11-27 DIAGNOSIS — C50919 Malignant neoplasm of unspecified site of unspecified female breast: Secondary | ICD-10-CM | POA: Diagnosis not present

## 2012-03-21 ENCOUNTER — Other Ambulatory Visit: Payer: Self-pay | Admitting: Oncology

## 2012-03-21 DIAGNOSIS — C50919 Malignant neoplasm of unspecified site of unspecified female breast: Secondary | ICD-10-CM

## 2012-05-01 ENCOUNTER — Other Ambulatory Visit (HOSPITAL_BASED_OUTPATIENT_CLINIC_OR_DEPARTMENT_OTHER): Payer: Medicare Other | Admitting: Lab

## 2012-05-01 DIAGNOSIS — C50219 Malignant neoplasm of upper-inner quadrant of unspecified female breast: Secondary | ICD-10-CM

## 2012-05-01 DIAGNOSIS — M810 Age-related osteoporosis without current pathological fracture: Secondary | ICD-10-CM | POA: Diagnosis not present

## 2012-05-01 LAB — COMPREHENSIVE METABOLIC PANEL (CC13)
AST: 20 U/L (ref 5–34)
Alkaline Phosphatase: 36 U/L — ABNORMAL LOW (ref 40–150)
BUN: 19 mg/dL (ref 7.0–26.0)
Creatinine: 0.8 mg/dL (ref 0.6–1.1)
Glucose: 100 mg/dl — ABNORMAL HIGH (ref 70–99)
Total Bilirubin: 0.3 mg/dL (ref 0.20–1.20)

## 2012-05-01 LAB — CBC WITH DIFFERENTIAL/PLATELET
Basophils Absolute: 0 10*3/uL (ref 0.0–0.1)
Eosinophils Absolute: 0.1 10*3/uL (ref 0.0–0.5)
HCT: 36.1 % (ref 34.8–46.6)
HGB: 12.1 g/dL (ref 11.6–15.9)
LYMPH%: 27.8 % (ref 14.0–49.7)
MCH: 31.5 pg (ref 25.1–34.0)
MCV: 93.8 fL (ref 79.5–101.0)
MONO%: 8.6 % (ref 0.0–14.0)
NEUT#: 2.8 10*3/uL (ref 1.5–6.5)
NEUT%: 61.6 % (ref 38.4–76.8)
Platelets: 143 10*3/uL — ABNORMAL LOW (ref 145–400)
RDW: 13.5 % (ref 11.2–14.5)

## 2012-05-08 ENCOUNTER — Ambulatory Visit (HOSPITAL_BASED_OUTPATIENT_CLINIC_OR_DEPARTMENT_OTHER): Payer: Medicare Other | Admitting: Oncology

## 2012-05-08 ENCOUNTER — Encounter: Payer: Self-pay | Admitting: *Deleted

## 2012-05-08 ENCOUNTER — Telehealth: Payer: Self-pay | Admitting: *Deleted

## 2012-05-08 VITALS — BP 139/81 | HR 96 | Temp 98.4°F | Resp 20 | Ht 60.5 in | Wt 107.2 lb

## 2012-05-08 DIAGNOSIS — E559 Vitamin D deficiency, unspecified: Secondary | ICD-10-CM

## 2012-05-08 DIAGNOSIS — C50219 Malignant neoplasm of upper-inner quadrant of unspecified female breast: Secondary | ICD-10-CM

## 2012-05-08 DIAGNOSIS — C50919 Malignant neoplasm of unspecified site of unspecified female breast: Secondary | ICD-10-CM

## 2012-05-08 DIAGNOSIS — Z170 Estrogen receptor positive status [ER+]: Secondary | ICD-10-CM

## 2012-05-08 MED ORDER — LETROZOLE/PLACEBO 2.5MG TABS #110 NSABP B-42: 2.5000 mg | ORAL_TABLET | Freq: Every day | ORAL | Status: DC

## 2012-05-08 NOTE — Progress Notes (Unsigned)
Sept. 13, 2013 NSABP B-42, 48 month visit Maureen [name] is in the cancer center today for physical exam and study medication exchange. She returned bottles 15 & 16 which were taken to the pharmacy for counting and destruction. She was given bottles 17 and 18. Pill count reported by pharmacy is 44 tabs returned.  Patient took 100% study medicine by patient report and pill count.   She reports she has not been hospitalized, not experienced any fracture, not had any medication changes since her last visit to the cancer center. She also reports that she has not experienced any adverse events. This was confirmed with her daughter who is accompanying her today.

## 2012-05-08 NOTE — Telephone Encounter (Signed)
Gave patient appointment for mammogram on 09-14-2012 at the breast center at 11:00am  05-07-2013 at 9:30am

## 2012-05-10 NOTE — Progress Notes (Signed)
Hematology and Oncology Follow Up Visit  Maureen [name] [id number] [birth year] 71 y.o. 05/10/2012 8:24 PM   DIAGNOSIS:   Encounter Diagnoses  Name Primary?  . Breast cancer Yes  . Unspecified vitamin D deficiency      PAST THERAPY:  1. T1c M0 breast cancer status post FAC times 6 completed 01/31/2003, radiation therapy completed 04/18/2003, on Arimidex for 5 years, now on B42 study.   2. History of osteoporosis. 3. Leukopenia. 4. History hypertension.  5. History of hyperlipidemia.  6. History of anxiety.  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 57 study this is the 48th month of followup on study. She is doing well. She is here with her daughter. She has had no intercurrent problems. Her overall medical health is been good.  Medications: I have reviewed the patient's current medications.  Allergies:  Allergies  Allergen Reactions  . Penicillins   . Sulfa Antibiotics     Heart palpitations    Past Medical History, Surgical history, Social history, and Family History were reviewed and updated.  Review of Systems: Constitutional:  Negative for fever, chills, night sweats, anorexia, weight loss, pain. Cardiovascular: no chest pain or dyspnea on exertion Respiratory: no cough, shortness of breath, or wheezing Neurological: no TIA or stroke symptoms Dermatological: negative ENT: negative Skin Gastrointestinal: negative Genito-Urinary: negative Hematological and Lymphatic: negative Breast: negative Musculoskeletal: negative Remaining ROS negative.  Physical Exam:  Blood pressure 139/81, pulse 96, temperature 98.4 F (36.9 C), temperature source Oral, resp. rate 20, height 5' 0.5" (1.537 m), weight 107 lb 3.2 oz (48.626 kg).  ECOG: 0   HEENT:  Sclerae anicteric, conjunctivae pink.  Oropharynx clear.  No mucositis or candidiasis.  Nodes:  No cervical,  supraclavicular, or axillary lymphadenopathy palpated.  Breast Exam:  Right breast is benign.  No masses, discharge, skin change, or nipple inversion.  Left breast is benign.  No masses, discharge, skin change, or nipple inversion..  Lungs:  Clear to auscultation bilaterally.  No crackles, rhonchi, or wheezes.  Heart:  Regular rate and rhythm.  Abdomen:  Soft, nontender.  Positive bowel sounds.  No organomegaly or masses palpated.  Musculoskeletal:  No focal spinal tenderness to palpation.  Extremities:  Benign.  No peripheral edema or cyanosis.  Skin:  Benign.  Neuro:  Nonfocal.    Lab Results: Lab Results  Component Value Date   WBC 4.5 05/01/2012   HGB 12.1 05/01/2012   HCT 36.1 05/01/2012   MCV 93.8 05/01/2012   PLT 143* 05/01/2012     Chemistry      Component Value Date/Time   NA 133* 05/01/2012 1434   NA 130* 05/03/2011 1432   NA 130* 05/03/2011 1432   K 4.8 05/01/2012 1434   K 4.3 05/03/2011 1432   K 4.3 05/03/2011 1432   CL 99 05/01/2012 1434   CL 95* 05/03/2011 1432   CL 95* 05/03/2011 1432   CO2 27 05/01/2012 1434   CO2 27 05/03/2011 1432   CO2 27 05/03/2011 1432   BUN 19.0 05/01/2012 1434   BUN 21 05/03/2011 1432   BUN 21 05/03/2011 1432   CREATININE 0.8 05/01/2012 1434   CREATININE 0.86 05/03/2011 1432   CREATININE 0.86 05/03/2011 1432      Component Value Date/Time   CALCIUM 9.3 05/01/2012 1434   CALCIUM 8.8 05/03/2011 1432   CALCIUM 8.8 05/03/2011 1432   ALKPHOS 36* 05/01/2012 1434  ALKPHOS 31* 05/03/2011 1432   ALKPHOS 31* 05/03/2011 1432   AST 20 05/01/2012 1434   AST 25 05/03/2011 1432   AST 25 05/03/2011 1432   ALT 15 05/01/2012 1434   ALT 18 05/03/2011 1432   ALT 18 05/03/2011 1432   BILITOT 0.30 05/01/2012 1434   BILITOT 0.4 05/03/2011 1432   BILITOT 0.4 05/03/2011 1432       Radiological Studies:  No results found.   IMPRESSIONS AND PLAN: A 71 y.o. female with   Node-negative ER/PR positive breast cancer status post chemotherapy and hormonal therapy now on B. 43 study. She will return in a years time to  appropriate imaging studies. Her most recent bone density test in October 2012 showed improvement clinically she is on Fosamax. Her labs from a week ago review she has an excellent vitamin D level and other labs are within normal limits. A mammogram will be scheduled for this November. I will see her in a years time.  Spent more than half the time coordinating care, as well as discussion of BMI and its implications.      Edgar Reisz 9/15/20138:24 PM Cell [phone]

## 2012-05-29 DIAGNOSIS — E7800 Pure hypercholesterolemia, unspecified: Secondary | ICD-10-CM | POA: Diagnosis not present

## 2012-05-29 DIAGNOSIS — I10 Essential (primary) hypertension: Secondary | ICD-10-CM | POA: Diagnosis not present

## 2012-05-29 DIAGNOSIS — M810 Age-related osteoporosis without current pathological fracture: Secondary | ICD-10-CM | POA: Diagnosis not present

## 2012-05-29 DIAGNOSIS — R7301 Impaired fasting glucose: Secondary | ICD-10-CM | POA: Diagnosis not present

## 2012-05-29 DIAGNOSIS — Z1211 Encounter for screening for malignant neoplasm of colon: Secondary | ICD-10-CM | POA: Diagnosis not present

## 2012-05-29 DIAGNOSIS — E039 Hypothyroidism, unspecified: Secondary | ICD-10-CM | POA: Diagnosis not present

## 2012-05-29 DIAGNOSIS — G44209 Tension-type headache, unspecified, not intractable: Secondary | ICD-10-CM | POA: Diagnosis not present

## 2012-05-29 DIAGNOSIS — F411 Generalized anxiety disorder: Secondary | ICD-10-CM | POA: Diagnosis not present

## 2012-06-02 DIAGNOSIS — E039 Hypothyroidism, unspecified: Secondary | ICD-10-CM | POA: Diagnosis not present

## 2012-06-02 DIAGNOSIS — R7301 Impaired fasting glucose: Secondary | ICD-10-CM | POA: Diagnosis not present

## 2012-06-02 DIAGNOSIS — M810 Age-related osteoporosis without current pathological fracture: Secondary | ICD-10-CM | POA: Diagnosis not present

## 2012-06-02 DIAGNOSIS — J309 Allergic rhinitis, unspecified: Secondary | ICD-10-CM | POA: Diagnosis not present

## 2012-06-02 DIAGNOSIS — M65849 Other synovitis and tenosynovitis, unspecified hand: Secondary | ICD-10-CM | POA: Diagnosis not present

## 2012-06-02 DIAGNOSIS — I10 Essential (primary) hypertension: Secondary | ICD-10-CM | POA: Diagnosis not present

## 2012-06-02 DIAGNOSIS — E7800 Pure hypercholesterolemia, unspecified: Secondary | ICD-10-CM | POA: Diagnosis not present

## 2012-06-02 DIAGNOSIS — Z23 Encounter for immunization: Secondary | ICD-10-CM | POA: Diagnosis not present

## 2012-06-02 DIAGNOSIS — M65839 Other synovitis and tenosynovitis, unspecified forearm: Secondary | ICD-10-CM | POA: Diagnosis not present

## 2012-06-30 DIAGNOSIS — H109 Unspecified conjunctivitis: Secondary | ICD-10-CM | POA: Diagnosis not present

## 2012-06-30 DIAGNOSIS — E119 Type 2 diabetes mellitus without complications: Secondary | ICD-10-CM | POA: Diagnosis not present

## 2012-07-13 DIAGNOSIS — L608 Other nail disorders: Secondary | ICD-10-CM | POA: Diagnosis not present

## 2012-07-13 DIAGNOSIS — S90129A Contusion of unspecified lesser toe(s) without damage to nail, initial encounter: Secondary | ICD-10-CM | POA: Diagnosis not present

## 2012-07-13 DIAGNOSIS — M79609 Pain in unspecified limb: Secondary | ICD-10-CM | POA: Diagnosis not present

## 2012-07-29 DIAGNOSIS — M25579 Pain in unspecified ankle and joints of unspecified foot: Secondary | ICD-10-CM | POA: Diagnosis not present

## 2012-07-29 DIAGNOSIS — S90129A Contusion of unspecified lesser toe(s) without damage to nail, initial encounter: Secondary | ICD-10-CM | POA: Diagnosis not present

## 2012-08-18 DIAGNOSIS — K120 Recurrent oral aphthae: Secondary | ICD-10-CM | POA: Diagnosis not present

## 2012-08-21 DIAGNOSIS — S90129A Contusion of unspecified lesser toe(s) without damage to nail, initial encounter: Secondary | ICD-10-CM | POA: Diagnosis not present

## 2012-08-21 DIAGNOSIS — M25579 Pain in unspecified ankle and joints of unspecified foot: Secondary | ICD-10-CM | POA: Diagnosis not present

## 2012-09-07 ENCOUNTER — Other Ambulatory Visit: Payer: Self-pay | Admitting: Oncology

## 2012-09-14 ENCOUNTER — Ambulatory Visit
Admission: RE | Admit: 2012-09-14 | Discharge: 2012-09-14 | Disposition: A | Discharge status: Home / Self Care | Payer: Medicare Other | Source: Ambulatory Visit | Attending: Oncology | Admitting: Oncology

## 2012-09-14 DIAGNOSIS — C50919 Malignant neoplasm of unspecified site of unspecified female breast: Secondary | ICD-10-CM

## 2012-09-14 DIAGNOSIS — Z1231 Encounter for screening mammogram for malignant neoplasm of breast: Secondary | ICD-10-CM | POA: Diagnosis not present

## 2012-09-14 DIAGNOSIS — E559 Vitamin D deficiency, unspecified: Secondary | ICD-10-CM

## 2012-09-25 ENCOUNTER — Telehealth: Payer: Self-pay | Admitting: *Deleted

## 2012-09-25 ENCOUNTER — Encounter: Payer: Self-pay | Admitting: Oncology

## 2012-09-25 NOTE — Telephone Encounter (Signed)
Confirmed 05/07/13 appt w/ pt.

## 2012-11-01 ENCOUNTER — Other Ambulatory Visit: Payer: Self-pay | Admitting: Oncology

## 2012-11-04 ENCOUNTER — Encounter: Payer: Medicare Other | Admitting: *Deleted

## 2012-11-04 ENCOUNTER — Encounter: Payer: Self-pay | Admitting: *Deleted

## 2012-11-04 MED ORDER — LETROZOLE/PLACEBO 2.5MG TABS #110 NSABP B-42: 2.5000 mg | ORAL_TABLET | Freq: Every day | ORAL | Status: DC

## 2012-11-04 NOTE — Progress Notes (Signed)
11/04/12 at 10:58am - NSABP B-42, month 54 study notes - The pt and her daughter were in the cancer center this am for her month 61 patient status update and drug exchange.  The pt returned her bottles 17 and 18.  Bottle 17 had 10 remaining pills inside.  Bottle 17 was taken to the pharmacy for the drug accountability check.  Bottle 18 was given back to the patient since it was to be taken through 11/15/12.  The pt reported that she may have "missed 1 pill" since her last follow-up visit in September 2013.  Therefore, the research nurse marked "almost all" on her follow up form.  The pt specifically denies any hospitalizations and any bone fractures.  The pt said that she "stumped" her toe in the fall of 2013, but the x-rays only revealed a "bone bruise" not a fracture.  The pt also denied any new health problems.  She stated that she tolerates the study drug well with no complaints.  The pt had her mammogram in January 2014.  The pt said that her annual physical is due in April 2014.  The research nurse will contact Dr. Toniann Fail McNeil's office in May to obtain the pt's office notes.  The pt was dispensed her last 2 study drug bottles (Bottles 19 and 20) for self administration.  The pt was instructed to continue taking her study drug everyday and to stop her study drug on 05/23/13 (5 year anniversary date).  The pt verbalized understanding. She was given a reminder card with her appointment for 05/25/13 (Month 60 EOT visit).

## 2012-11-27 DIAGNOSIS — M65849 Other synovitis and tenosynovitis, unspecified hand: Secondary | ICD-10-CM | POA: Diagnosis not present

## 2012-11-27 DIAGNOSIS — J309 Allergic rhinitis, unspecified: Secondary | ICD-10-CM | POA: Diagnosis not present

## 2012-11-27 DIAGNOSIS — E7800 Pure hypercholesterolemia, unspecified: Secondary | ICD-10-CM | POA: Diagnosis not present

## 2012-11-27 DIAGNOSIS — I10 Essential (primary) hypertension: Secondary | ICD-10-CM | POA: Diagnosis not present

## 2012-11-27 DIAGNOSIS — M810 Age-related osteoporosis without current pathological fracture: Secondary | ICD-10-CM | POA: Diagnosis not present

## 2012-11-27 DIAGNOSIS — R7301 Impaired fasting glucose: Secondary | ICD-10-CM | POA: Diagnosis not present

## 2012-11-27 DIAGNOSIS — M65839 Other synovitis and tenosynovitis, unspecified forearm: Secondary | ICD-10-CM | POA: Diagnosis not present

## 2012-11-27 DIAGNOSIS — E039 Hypothyroidism, unspecified: Secondary | ICD-10-CM | POA: Diagnosis not present

## 2012-11-30 ENCOUNTER — Other Ambulatory Visit: Payer: Self-pay | Admitting: Oncology

## 2012-11-30 DIAGNOSIS — C50919 Malignant neoplasm of unspecified site of unspecified female breast: Secondary | ICD-10-CM

## 2012-12-02 DIAGNOSIS — F339 Major depressive disorder, recurrent, unspecified: Secondary | ICD-10-CM | POA: Diagnosis not present

## 2012-12-02 DIAGNOSIS — R7301 Impaired fasting glucose: Secondary | ICD-10-CM | POA: Diagnosis not present

## 2012-12-02 DIAGNOSIS — Z0000 Encounter for general adult medical examination without abnormal findings: Secondary | ICD-10-CM | POA: Diagnosis not present

## 2012-12-02 DIAGNOSIS — M810 Age-related osteoporosis without current pathological fracture: Secondary | ICD-10-CM | POA: Diagnosis not present

## 2012-12-02 DIAGNOSIS — I10 Essential (primary) hypertension: Secondary | ICD-10-CM | POA: Diagnosis not present

## 2012-12-02 DIAGNOSIS — G43909 Migraine, unspecified, not intractable, without status migrainosus: Secondary | ICD-10-CM | POA: Diagnosis not present

## 2012-12-02 DIAGNOSIS — E039 Hypothyroidism, unspecified: Secondary | ICD-10-CM | POA: Diagnosis not present

## 2012-12-02 DIAGNOSIS — E7800 Pure hypercholesterolemia, unspecified: Secondary | ICD-10-CM | POA: Diagnosis not present

## 2012-12-22 DIAGNOSIS — Z1211 Encounter for screening for malignant neoplasm of colon: Secondary | ICD-10-CM | POA: Diagnosis not present

## 2013-03-10 DIAGNOSIS — T783XXA Angioneurotic edema, initial encounter: Secondary | ICD-10-CM | POA: Diagnosis not present

## 2013-03-12 DIAGNOSIS — K1370 Unspecified lesions of oral mucosa: Secondary | ICD-10-CM | POA: Diagnosis not present

## 2013-05-07 ENCOUNTER — Ambulatory Visit: Payer: Medicare Other | Admitting: Oncology

## 2013-05-07 ENCOUNTER — Other Ambulatory Visit: Payer: Medicare Other | Admitting: Lab

## 2013-05-07 ENCOUNTER — Ambulatory Visit: Payer: Medicare Other | Admitting: Family

## 2013-05-17 ENCOUNTER — Other Ambulatory Visit: Payer: Self-pay | Admitting: Oncology

## 2013-05-17 DIAGNOSIS — C50919 Malignant neoplasm of unspecified site of unspecified female breast: Secondary | ICD-10-CM

## 2013-05-19 ENCOUNTER — Telehealth: Payer: Self-pay | Admitting: *Deleted

## 2013-05-19 NOTE — Telephone Encounter (Signed)
05/19/13 at 10:05am - The research nurse called the pt's daughter, Maureen [name], to discuss her mother's study drug discontinuation.  The pt began her study drug on 05/23/2008.  Therefore, her last day of study drug should be on 05/23/2013 (Sunday) for a total of 5 years on study drug (letrozole or placebo).  The research nurse wanted to contact the pt's daughter in advance to remind her that her mother's last day of study drug should be on Sunday, 05/23/13.  Maureen [name] verbalized understanding and she stated that she will bring all of the study drug back to the cancer center for the final drug accountability check.  The research nurse thanked Maureen [name] and her mother for all of their support during this clinical trial.  The pt's month 60/End of Treatment visit is scheduled for 05/25/13.

## 2013-05-25 ENCOUNTER — Encounter: Payer: Self-pay | Admitting: *Deleted

## 2013-05-25 ENCOUNTER — Telehealth: Payer: Self-pay | Admitting: *Deleted

## 2013-05-25 ENCOUNTER — Encounter: Payer: Self-pay | Admitting: Family

## 2013-05-25 ENCOUNTER — Ambulatory Visit (HOSPITAL_BASED_OUTPATIENT_CLINIC_OR_DEPARTMENT_OTHER): Payer: Medicare Other | Admitting: Family

## 2013-05-25 VITALS — BP 127/78 | HR 86 | Temp 98.1°F | Resp 20 | Ht 60.5 in | Wt 104.3 lb

## 2013-05-25 DIAGNOSIS — M8580 Other specified disorders of bone density and structure, unspecified site: Secondary | ICD-10-CM

## 2013-05-25 DIAGNOSIS — Z853 Personal history of malignant neoplasm of breast: Secondary | ICD-10-CM | POA: Diagnosis not present

## 2013-05-25 DIAGNOSIS — M899 Disorder of bone, unspecified: Secondary | ICD-10-CM | POA: Diagnosis not present

## 2013-05-25 NOTE — Progress Notes (Addendum)
[facility]  [phone] [phone]  OFFICE PROGRESS NOTE   ID: Maureen [name]   DOB: August 20, 1941  MR#: [id number]  VHQ#:[id number]   PCP: Gweneth Dimitri, MD SU:  Griselda Miner, M.D. RAD ONC: Chipper Herb, M.D.   HISTORY OF PRESENT ILLNESS: From Dr. Theron Arista Rubin's new patient evaluation note dated 09/24/2002: "Ms. Czerwinski is a 72 year old [place] woman from [place].  Ms. Parkhurst has been in relatively good health all her life.  She has a history of a number of chronic medical problems.  Essentially, she had a mammogram done last year which suggested the presence of an abnormality in the left breast.  This was followed.  Subsequent mammogram done on 08/04/2003 showed calcification of the left breast.  Ultrasound done on 08/27/2002 identified the mass in the left breast.  This was biopsied on 08/27/2002.  Biopsies revealed invasive carcinoma, ductal type.  ER strongly positive at 92%.  PR positive at 3%.  The proliferative index positive at 38% with her to 1+, DNA index of 0.98.  This is a cancer when a localized excision of this mass on 09/02/2002.  Final pathology revealed 1.5 cm grade 33, ductal carcinoma.  No lymphovascular invasion was noted.  The largest tumor approached within 0.1 mm of deep aspect of the specimen.  Additional tissue from the left superior margin was negative as was from the deep margin.  Additional tissue removed from that location was also similarly negative.  One sentinel lymph node was removed which was negative for metastatic disease.  As noted, this was ER/PR positive tumor.  The patient has had unremarkable postoperative course.  This was a nonpalpable lesion."  Her subsequent history is as detailed below.   INTERVAL HISTORY: Dr. 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  She is accompanied for today's office visit by her daughter Joyce Gross and  Nurse, mental health.   The patient was last seen by Dr. Donnie Coffin on 05/08/2012.  Since her last office visit, the patient has been doing relatively well.  Her interval history is significant for contracting an oral viral infection in 02/2013 which resulted in numerous mouth sores.  She was prescribed acyclovir and after 6 days of taking valacyclovir she developed facial edema.  She was given a steroid injection of triamcinolone and subsequently her mouth sores worsened.  Her daughter stated that during this time her mother could not talk, eat or drink fluids effectively.  The patient was subsequently given a prescription for valacyclovir and her symptoms started resolving.  The pathology of the mouth sores came back that it was not herpes zoster.  Valacyclovir was discontinued.  Mouth lesions spontaneously cleared on their own.   Mrs. Hurtado is a participant in the B-42 protocol and stopped taking investigational medication on 05/23/2013.  Mrs. Lanter is establishing herself with Dr. Darrall Dears service today.   REVIEW OF SYSTEMS: A 10 point review of systems was completed and is negative except occasional sinus headaches and fatigue.  Mrs. Wanek denies any other symptomatology including fever or chills, headache, vision changes, swollen glands, cough or shortness of breath, chest pain or discomfort, nausea, vomiting, diarrhea, constipation, change in urinary or bowel habits, arthralgias/myalgias, unusual bleeding/bruising or any other symptomatology.   PAST MEDICAL HISTORY: Past Medical History  Diagnosis Date  . Breast cancer 09/02/2002  . Hypertension   . Anxiety   . Hyperlipidemia   . Osteoporosis  pt is on Fosamax  . Leukopenia     PAST SURGICAL HISTORY: Past Surgical History  Procedure Laterality Date  . Breast surgery      Breast  biopsy x 3 right  x 4 left  . Breast lumpectomy  09/02/2002  . Lipoma excision  1989    Left shoulder lipoma excision  . Abdominal hysterectomy  06/1976  .  Appendectomy  1969    FAMILY HISTORY Family History  Problem Relation Age of Onset  . Cancer Father     Stomach cancer  . Aneurysm Sister   . Hypertension Sister   . Diabetes Brother   . Hypertension Brother   . Hypertension Sister   . Cancer Sister     Breast cancer    GYNECOLOGIC HISTORY: Gravida 2, para 2, age of menarche 62, positive history for breast feeding, hysterectomy in 06/1976,  menopause in the late 1980s, history of hormone replacement therapy for 7 years from 64 through 1997, no history of birth control use.  SOCIAL HISTORY: Mr. and Mrs. Guillot have been married since 1964.  Her husband Delman Cheadle is a retired Psychologist, educational from a company who makes plastic film.  They have been living [place] since 1995.  Prior to that, they lived in [place], and prior to that they lived in [place]. [place].  Their daughter Joyce Gross lives with them at home.  They are originally from [place].  In her spare time Mrs. Englert enjoys cooking, traveling, and reading.   ADVANCED DIRECTIVES: Not on file   HEALTH MAINTENANCE: History  Substance Use Topics  . Smoking status: Never Smoker   . Smokeless tobacco: Never Used  . Alcohol Use: Yes     Comment: Rarely - special occasions    Colonoscopy: Not on file PAP: Not on file Bone density: The patient's last bone density scan on 06/14/2011 showed a T score of -1.8 (osteopenia). Lipid panel: Not on file   Allergies  Allergen Reactions  . Penicillins   . Sulfa Antibiotics     Heart palpitations  . Acyclovir And Related Swelling    Facial swelling Valacyclovir is ok to use  . Triamcinolone Other (See Comments)    Mouth sores    Current Outpatient Prescriptions  Medication Sig Dispense Refill  . alendronate (FOSAMAX) 70 MG tablet TAKE 1 TABLET BY MOUTH ONCE WEEKLY  12 tablet  0  . amLODipine (NORVASC) 5 MG tablet Take 5 mg by mouth daily.        Marland Kitchen CALCIUM-MAGNESIUM-VITAMIN D PO Take 2 tablets by mouth 2 (two) times daily.      .  cholecalciferol (VITAMIN D) 400 UNITS TABS tablet Take 400 Units by mouth daily.      . citalopram (CELEXA) 10 MG tablet Take 15 mg by mouth daily. Pt's medical record lists her celexa dose as 15mg  daily      . levothyroxine (SYNTHROID, LEVOTHROID) 25 MCG tablet Take 25 mcg by mouth daily. Pt's medical record list synthroid/Levoxyl 0.025 mg as pt's reported dose.       . Multiple Vitamins-Minerals (MULTIVITAMIN PO) Take 1 tablet by mouth daily.      Marland Kitchen zolpidem (AMBIEN) 5 MG tablet Take 5 mg by mouth at bedtime as needed.         No current facility-administered medications for this visit.    OBJECTIVE: Filed Vitals:   05/25/13 0901  BP: 127/78  Pulse: 86  Temp: 98.1 F (36.7 C)  Resp: 20     Body mass index is  20.03 kg/(m^2).      ECOG FS: 1 - Symptomatic but completely ambulatory  General appearance: Alert, cooperative, thin frame, no apparent distress Head: Normocephalic, without obvious abnormality, atraumatic, left thigh and left temple area hyperpigmented birthmark Eyes: Arcus senilis, PERRLA, EOMI Nose: Nares, septum and mucosa are normal, no drainage or sinus tenderness Neck: No adenopathy, supple, symmetrical, trachea midline, no tenderness Resp: Clear to auscultation bilaterally, no wheezes/rales/rhonchi Cardio: Regular rate and rhythm, S1, S2 normal, no murmur, click, rub or gallop, no edema Breasts:  Glandular breast tissue bilaterally, right and left breasts have well-healed surgical scars, left breast shows architectural changes,, no nipple inversion, bilateral axillary fullness GI: Soft, not distended, non-tender, hypoactive bowel sounds, no organomegaly Skin: No rashes/lesions, skin warm and dry, no erythematous areas, no cyanosis  M/S:  Atraumatic, normal strength in all extremities, normal range of motion, no clubbing  Lymph nodes: Cervical, supraclavicular, and axillary nodes normal Neurologic: Grossly normal, cranial nerves II through XII intact, alert and oriented  x 3 Psych: Appropriate affect   LAB RESULTS: Lab Results  Component Value Date   WBC 4.5 05/01/2012   NEUTROABS 2.8 05/01/2012   HGB 12.1 05/01/2012   HCT 36.1 05/01/2012   MCV 93.8 05/01/2012   PLT 143* 05/01/2012      Chemistry      Component Value Date/Time   NA 133* 05/01/2012 1434   NA 130* 05/03/2011 1432   K 4.8 05/01/2012 1434   K 4.3 05/03/2011 1432   CL 99 05/01/2012 1434   CL 95* 05/03/2011 1432   CO2 27 05/01/2012 1434   CO2 27 05/03/2011 1432   BUN 19.0 05/01/2012 1434   BUN 21 05/03/2011 1432   CREATININE 0.8 05/01/2012 1434   CREATININE 0.86 05/03/2011 1432      Component Value Date/Time   CALCIUM 9.3 05/01/2012 1434   CALCIUM 8.8 05/03/2011 1432   ALKPHOS 36* 05/01/2012 1434   ALKPHOS 31* 05/03/2011 1432   AST 20 05/01/2012 1434   AST 25 05/03/2011 1432   ALT 15 05/01/2012 1434   ALT 18 05/03/2011 1432   BILITOT 0.30 05/01/2012 1434   BILITOT 0.4 05/03/2011 1432      Lab Results  Component Value Date   LABCA2 8 05/03/2011    Urinalysis No results found for this basename: colorurine,  appearanceur,  labspec,  phurine,  glucoseu,  hgbur,  bilirubinur,  ketonesur,  proteinur,  urobilinogen,  nitrite,  leukocytesur    STUDIES: 1.  Mm Digital Screening 09/15/2012   *RADIOLOGY REPORT*  Clinical Data: Screening.  DIGITAL BILATERAL SCREENING MAMMOGRAM WITH CAD DIGITAL BREAST TOMOSYNTHESIS  Digital breast tomosynthesis images are acquired in two projections.  These images are reviewed in combination with the digital mammogram, confirming the findings below.  Comparison:  Previous exams.  FINDINGS:  ACR Breast Density Category 3: The breast tissue is heterogeneously dense.  No suspicious masses, architectural distortion, or calcifications are present.  Images were processed with CAD.  IMPRESSION: No mammographic evidence of malignancy.  A result letter of this screening mammogram will be mailed directly to the patient.  RECOMMENDATION: Screening mammogram in one year. (Code:SM-B-01Y)  BI-RADS CATEGORY 1:   Negative.   Original Report Authenticated By: Baird Lyons, M.D.    2.The patient's last bone density scan on 06/14/2011 showed a T score of -1.8 (osteopenia).    ASSESSMENT: 72 y.o. [place], [place]:  1.  Status post left breast needle core biopsy at the 11:00 position on 08/27/2002 which showed  invasive mammary carcinoma with features of invasive ductal carcinoma, estrogen receptor 92% positive, progesterone receptor 33% positive, Ki-67 38%, HER-2/neu 1+ negative.  2.  Status post left breast lumpectomy with left sentinel node biopsy and additional deep and left superior margin excisions on 09/02/2002 for a stage I, pT1c pN0 (i -)(sn)pMX, 1.5 cm invasive ductal carcinoma, grade 3, with high-grade ductal carcinoma in situ, no lymphovascular invasion identified, prognostic panel not repeated, with 0/1 metastatic left axillary lymph node.  3.  The patient had a bilateral breast MRI on 09/03/2006 which showed there is a 1 cm area of irregular plateau-type enhancement in the left breast 12 o'clock location adjacent to the prior lumpectomy site at 11 o'clock.  No suspicious enhancement is seen on the right breast. No lymphadenopathy or T2-weighted hyperintensity on either side.  4.  The patient underwent adjuvant chemotherapy with FEC (5-FU/epirubicin/Cytoxan) x 6 cycles completed on 01/31/2003 with Neulasta support.  5.  The patient had radiation therapy from 03/08/2003 through 04/18/2003.  6.  Antiestrogen therapy with Arimidex started in 04/2003.  5 years of antiestrogen therapy with Arimidex was completed in 04/2008.   7.  Status post left breast excisional biopsy on 09/12/2005 which showed benign breast parenchyma with dense fibroconnective tissue stroma and atrophic lobular units with calcifications.  8.  The patient became a participant in the NSABP B-42 protocol and 04/2008 until 05/23/2013.  9.  Osteopenia  PLAN: The patient is over 10  years from her time of diagnosis  and will officially become a graduate of [facility]'s breast cancer program today. The patient's daughter Joyce Gross was present for her graduation ceremony. We asked that she continue annual clinical breast examinations by a physician in addition to annual mammography.  Her most recent mammogram results are listed above.    Mrs. Circle was encouraged to continue taking Fosamax 70 mg Q7 days, calcium with vitamin D, additional vitamin D daily and daily walking for bone strengthening related to her osteopenia.  We will schedule Mrs. Elliot Dally for a bone density examination due in 05/2013.   All questions were answered.  The patient was encouraged to contact us with any problems, questions or concerns.   Larina Bras, NP-C 05/25/2013, 4:03 PM  ADDENDUM: I met with this 45 year old [place] woman (originally from [place]) and her daughter today and reviewed Ieasha's diagnosis, treatment history, and prognosis. In brief:  She underwent left lumpectomy and sentinel lymph node dissection January of 2004 for a pT1c pN0, stage IA invasive ductal carcinoma, grade 3, which was estrogen receptor 92% positive, progesterone receptor 33% positive, with an MIB-1 of 38% and no HER-2 amplification.  Adjuvantly she received 6 cycles of fluorouracil, epirubicin and cyclophosphamide at standard doses completed June of 2004. She then received adjuvant radiation completed August of 2004.  The patient started anastrozole September of 2004 and continue that for 5 years, to September of 2009. At that point she enrolled in the B-42 study and received either letrozole or placebo for an additional 5 years (the good has not yet been broken). She has now completed this study mandated treatments and is ready to "graduate" from followup here.  Jurney understands her pertussis is very good. She will need yearly mammography and yearly physician breast exam for continuing followup of her history of breast cancer. We will be glad to see her at  any point in the future if the need arises, butas of now no further routine appointments are being made for her here.  I personally saw this  patient and performed a substantive portion of this encounter with the listed APP documented above.   Lowella Dell, MD

## 2013-05-25 NOTE — Telephone Encounter (Signed)
appts made and printed...td 

## 2013-05-25 NOTE — Patient Instructions (Addendum)
Please contact us at [phone] if you have any questions or concerns.  Please continue to do well and enjoy life!!!  Get plenty of rest, drink plenty of water, exercise daily, eat a balanced diet.  Continue taking calcium daily in addition to vitamin D3 daily.   Complete monthly self-breast examinations.  Have a clinical breast exam by a physician every year.  Have your mammogram completed every year.

## 2013-05-25 NOTE — Progress Notes (Unsigned)
05/25/13 at 10:13am - NSABP B-42 month 60/ EOT visit-  The pt was into the cancer center this am for her end of treatment visit.  The pt stated she took her last dose of study drug on 05/23/13.  She returned her 3 study drug bottles for the final drug accountability check.  She stated that she took "100%" of her study drug.  She denied missing any doses.  The pt's 3 bottles were taken to the pharmacy for counting and disposal.  Bottle  #18 had 19 tabs.  Bottle #19 had 20 remaining.  Bottle #20 had 12 tabs remaining.  The pt was thanked for the continued support and compliance in this study.  The pt was seen and examined today by Norina Buzzard (Dr. Darrall Dears NP).  The denies any bone fractures and any hospitalizations since her last follow -up visit.  Her next bone density test (off-study) will be due in October 2014.  She specifically denies any new cardiac, gastrointestinal, musculosketetal, and any nervous system problems.  She also denies any hot flashes and any vaginal bleeding.  Her daughter reports that her mother has fatigue, but she states that her mother cooks all day in the kitchen.  Her latest lipid panel was normal from April 2014.  The pt did state that in August she developed some "mouth sores" that were first believed to be from a virus.  She said that she was treated with various oral anti-viral agents and it eventually cleared.  She said that her physician is still unsure what caused the "oral sores".   Mucosal infection- grade 2.  The pt was informed that the research nurse would contact her by phone in 6 months to discuss any new adverse events that may have occurred after she discontinued the study drug.  The pt verbalized understanding.

## 2013-06-03 DIAGNOSIS — Z23 Encounter for immunization: Secondary | ICD-10-CM | POA: Diagnosis not present

## 2013-06-03 DIAGNOSIS — J309 Allergic rhinitis, unspecified: Secondary | ICD-10-CM | POA: Diagnosis not present

## 2013-06-03 DIAGNOSIS — M810 Age-related osteoporosis without current pathological fracture: Secondary | ICD-10-CM | POA: Diagnosis not present

## 2013-06-03 DIAGNOSIS — E7800 Pure hypercholesterolemia, unspecified: Secondary | ICD-10-CM | POA: Diagnosis not present

## 2013-06-03 DIAGNOSIS — F411 Generalized anxiety disorder: Secondary | ICD-10-CM | POA: Diagnosis not present

## 2013-06-03 DIAGNOSIS — I10 Essential (primary) hypertension: Secondary | ICD-10-CM | POA: Diagnosis not present

## 2013-06-03 DIAGNOSIS — R7301 Impaired fasting glucose: Secondary | ICD-10-CM | POA: Diagnosis not present

## 2013-06-03 DIAGNOSIS — E039 Hypothyroidism, unspecified: Secondary | ICD-10-CM | POA: Diagnosis not present

## 2013-06-03 DIAGNOSIS — L84 Corns and callosities: Secondary | ICD-10-CM | POA: Diagnosis not present

## 2013-06-14 ENCOUNTER — Ambulatory Visit
Admission: RE | Admit: 2013-06-14 | Discharge: 2013-06-14 | Disposition: A | Discharge status: Home / Self Care | Payer: Medicare Other | Source: Ambulatory Visit | Attending: Family | Admitting: Family

## 2013-06-14 DIAGNOSIS — Z853 Personal history of malignant neoplasm of breast: Secondary | ICD-10-CM

## 2013-06-14 DIAGNOSIS — M899 Disorder of bone, unspecified: Secondary | ICD-10-CM | POA: Diagnosis not present

## 2013-06-14 DIAGNOSIS — M8580 Other specified disorders of bone density and structure, unspecified site: Secondary | ICD-10-CM

## 2013-06-23 ENCOUNTER — Telehealth: Payer: Self-pay

## 2013-06-23 NOTE — Telephone Encounter (Signed)
Spoke with pt's daughter after verifying DOB about results of DEXA scan.  Advised her to continue taking Fosamax Q7 days, Calcium+ vitamin D(daily),  Vitamin D3(daily) and walk daily for osteopenia per Annice Pih NP.  Daughter voiced understanding and stated that her mom is doing this.  Encouraged pt or daughter to call back with any further questions.

## 2013-07-02 DIAGNOSIS — E119 Type 2 diabetes mellitus without complications: Secondary | ICD-10-CM | POA: Diagnosis not present

## 2013-08-10 ENCOUNTER — Other Ambulatory Visit: Payer: Self-pay

## 2013-08-10 DIAGNOSIS — Z1231 Encounter for screening mammogram for malignant neoplasm of breast: Secondary | ICD-10-CM

## 2013-09-07 DIAGNOSIS — J069 Acute upper respiratory infection, unspecified: Secondary | ICD-10-CM | POA: Diagnosis not present

## 2013-09-16 ENCOUNTER — Ambulatory Visit
Admission: RE | Admit: 2013-09-16 | Discharge: 2013-09-16 | Disposition: A | Discharge status: Home / Self Care | Payer: Medicare Other | Source: Ambulatory Visit

## 2013-09-16 DIAGNOSIS — Z1231 Encounter for screening mammogram for malignant neoplasm of breast: Secondary | ICD-10-CM | POA: Diagnosis not present

## 2013-11-24 ENCOUNTER — Telehealth: Payer: Self-pay | Admitting: *Deleted

## 2013-11-24 NOTE — Telephone Encounter (Signed)
11/24/13 at 4:22pm - Final AE assessment - The research nurse called to check on the pt's status since she discontinued study drug on 05/23/13.  The pt's daughter, Zigmund Daniel, stated that her mom has been in her "usual state of health".  She specifically denies any new health problems in the last 6 months.  She states her mom has been "doing well" with no hospitalizations.  The research nurse informed the pt's daughter that the study has not disclosed what drug her mother was taking during her study participation.  The research nurse told the daughter that when that information becomes available then her mother will be contacted and notified regarding the "unblinding" of her study drug.  The research nurse thanked the pt's mother and her daughter for all of their support during this trial.

## 2013-12-01 DIAGNOSIS — F411 Generalized anxiety disorder: Secondary | ICD-10-CM | POA: Diagnosis not present

## 2013-12-01 DIAGNOSIS — I10 Essential (primary) hypertension: Secondary | ICD-10-CM | POA: Diagnosis not present

## 2013-12-01 DIAGNOSIS — M810 Age-related osteoporosis without current pathological fracture: Secondary | ICD-10-CM | POA: Diagnosis not present

## 2013-12-01 DIAGNOSIS — Z23 Encounter for immunization: Secondary | ICD-10-CM | POA: Diagnosis not present

## 2013-12-01 DIAGNOSIS — J309 Allergic rhinitis, unspecified: Secondary | ICD-10-CM | POA: Diagnosis not present

## 2013-12-01 DIAGNOSIS — E039 Hypothyroidism, unspecified: Secondary | ICD-10-CM | POA: Diagnosis not present

## 2013-12-01 DIAGNOSIS — R7301 Impaired fasting glucose: Secondary | ICD-10-CM | POA: Diagnosis not present

## 2013-12-01 DIAGNOSIS — E7800 Pure hypercholesterolemia, unspecified: Secondary | ICD-10-CM | POA: Diagnosis not present

## 2013-12-06 DIAGNOSIS — Z1211 Encounter for screening for malignant neoplasm of colon: Secondary | ICD-10-CM | POA: Diagnosis not present

## 2013-12-06 DIAGNOSIS — R7301 Impaired fasting glucose: Secondary | ICD-10-CM | POA: Diagnosis not present

## 2013-12-06 DIAGNOSIS — Z23 Encounter for immunization: Secondary | ICD-10-CM | POA: Diagnosis not present

## 2013-12-06 DIAGNOSIS — E039 Hypothyroidism, unspecified: Secondary | ICD-10-CM | POA: Diagnosis not present

## 2013-12-06 DIAGNOSIS — I10 Essential (primary) hypertension: Secondary | ICD-10-CM | POA: Diagnosis not present

## 2013-12-06 DIAGNOSIS — M810 Age-related osteoporosis without current pathological fracture: Secondary | ICD-10-CM | POA: Diagnosis not present

## 2013-12-06 DIAGNOSIS — C50919 Malignant neoplasm of unspecified site of unspecified female breast: Secondary | ICD-10-CM | POA: Diagnosis not present

## 2013-12-06 DIAGNOSIS — Z0000 Encounter for general adult medical examination without abnormal findings: Secondary | ICD-10-CM | POA: Diagnosis not present

## 2014-02-16 DIAGNOSIS — L259 Unspecified contact dermatitis, unspecified cause: Secondary | ICD-10-CM | POA: Diagnosis not present

## 2014-03-16 DIAGNOSIS — L259 Unspecified contact dermatitis, unspecified cause: Secondary | ICD-10-CM | POA: Diagnosis not present

## 2014-03-17 DIAGNOSIS — K130 Diseases of lips: Secondary | ICD-10-CM | POA: Diagnosis not present

## 2014-03-18 DIAGNOSIS — R110 Nausea: Secondary | ICD-10-CM | POA: Diagnosis not present

## 2014-03-18 DIAGNOSIS — K120 Recurrent oral aphthae: Secondary | ICD-10-CM | POA: Diagnosis not present

## 2014-05-12 DIAGNOSIS — Z1211 Encounter for screening for malignant neoplasm of colon: Secondary | ICD-10-CM | POA: Diagnosis not present

## 2014-06-02 DIAGNOSIS — E039 Hypothyroidism, unspecified: Secondary | ICD-10-CM | POA: Diagnosis not present

## 2014-06-02 DIAGNOSIS — R7301 Impaired fasting glucose: Secondary | ICD-10-CM | POA: Diagnosis not present

## 2014-06-02 DIAGNOSIS — I10 Essential (primary) hypertension: Secondary | ICD-10-CM | POA: Diagnosis not present

## 2014-06-02 DIAGNOSIS — M810 Age-related osteoporosis without current pathological fracture: Secondary | ICD-10-CM | POA: Diagnosis not present

## 2014-06-07 DIAGNOSIS — M810 Age-related osteoporosis without current pathological fracture: Secondary | ICD-10-CM | POA: Diagnosis not present

## 2014-06-07 DIAGNOSIS — R7309 Other abnormal glucose: Secondary | ICD-10-CM | POA: Diagnosis not present

## 2014-06-07 DIAGNOSIS — Z23 Encounter for immunization: Secondary | ICD-10-CM | POA: Diagnosis not present

## 2014-06-07 DIAGNOSIS — F419 Anxiety disorder, unspecified: Secondary | ICD-10-CM | POA: Diagnosis not present

## 2014-06-07 DIAGNOSIS — J309 Allergic rhinitis, unspecified: Secondary | ICD-10-CM | POA: Diagnosis not present

## 2014-06-07 DIAGNOSIS — I10 Essential (primary) hypertension: Secondary | ICD-10-CM | POA: Diagnosis not present

## 2014-06-07 DIAGNOSIS — Z8639 Personal history of other endocrine, nutritional and metabolic disease: Secondary | ICD-10-CM | POA: Diagnosis not present

## 2014-06-23 ENCOUNTER — Telehealth: Payer: Self-pay | Admitting: Oncology

## 2014-06-23 NOTE — Telephone Encounter (Signed)
06/23/14 - 4:30 - Called patient and spoke with her daughter Zigmund Daniel.  She stated her mother is doing well.  She had not been in the hospital in the last year.  She had not had any fractures or broken bones. She is not on any medicines for her bones.  She has not had a bone density test.  She has not had any cardiac events or thrombotic events in the last year.  Will call and check on patient again next year.

## 2014-06-27 ENCOUNTER — Other Ambulatory Visit: Payer: Self-pay | Admitting: Nurse Practitioner

## 2014-07-13 DIAGNOSIS — E119 Type 2 diabetes mellitus without complications: Secondary | ICD-10-CM | POA: Diagnosis not present

## 2014-08-05 ENCOUNTER — Other Ambulatory Visit: Payer: Self-pay

## 2014-08-05 DIAGNOSIS — Z1231 Encounter for screening mammogram for malignant neoplasm of breast: Secondary | ICD-10-CM

## 2014-09-08 DIAGNOSIS — Z8639 Personal history of other endocrine, nutritional and metabolic disease: Secondary | ICD-10-CM | POA: Diagnosis not present

## 2014-09-19 ENCOUNTER — Ambulatory Visit
Admission: RE | Admit: 2014-09-19 | Discharge: 2014-09-19 | Disposition: A | Discharge status: Home / Self Care | Payer: Medicare Other | Source: Ambulatory Visit

## 2014-09-19 DIAGNOSIS — Z1231 Encounter for screening mammogram for malignant neoplasm of breast: Secondary | ICD-10-CM

## 2014-11-08 DIAGNOSIS — S61211A Laceration without foreign body of left index finger without damage to nail, initial encounter: Secondary | ICD-10-CM | POA: Diagnosis not present

## 2014-12-05 DIAGNOSIS — R7301 Impaired fasting glucose: Secondary | ICD-10-CM | POA: Diagnosis not present

## 2014-12-05 DIAGNOSIS — Z8639 Personal history of other endocrine, nutritional and metabolic disease: Secondary | ICD-10-CM | POA: Diagnosis not present

## 2014-12-05 DIAGNOSIS — E039 Hypothyroidism, unspecified: Secondary | ICD-10-CM | POA: Diagnosis not present

## 2014-12-05 DIAGNOSIS — M810 Age-related osteoporosis without current pathological fracture: Secondary | ICD-10-CM | POA: Diagnosis not present

## 2014-12-05 DIAGNOSIS — Z23 Encounter for immunization: Secondary | ICD-10-CM | POA: Diagnosis not present

## 2014-12-05 DIAGNOSIS — I10 Essential (primary) hypertension: Secondary | ICD-10-CM | POA: Diagnosis not present

## 2014-12-08 ENCOUNTER — Other Ambulatory Visit: Payer: Self-pay | Admitting: Family Medicine

## 2014-12-08 DIAGNOSIS — Z0000 Encounter for general adult medical examination without abnormal findings: Secondary | ICD-10-CM | POA: Diagnosis not present

## 2014-12-08 DIAGNOSIS — R1310 Dysphagia, unspecified: Secondary | ICD-10-CM

## 2014-12-08 DIAGNOSIS — R05 Cough: Secondary | ICD-10-CM | POA: Diagnosis not present

## 2014-12-08 DIAGNOSIS — I10 Essential (primary) hypertension: Secondary | ICD-10-CM | POA: Diagnosis not present

## 2014-12-08 DIAGNOSIS — Z853 Personal history of malignant neoplasm of breast: Secondary | ICD-10-CM | POA: Diagnosis not present

## 2014-12-08 DIAGNOSIS — F419 Anxiety disorder, unspecified: Secondary | ICD-10-CM | POA: Diagnosis not present

## 2014-12-08 DIAGNOSIS — Z124 Encounter for screening for malignant neoplasm of cervix: Secondary | ICD-10-CM | POA: Diagnosis not present

## 2014-12-08 DIAGNOSIS — M810 Age-related osteoporosis without current pathological fracture: Secondary | ICD-10-CM | POA: Diagnosis not present

## 2014-12-08 DIAGNOSIS — R5383 Other fatigue: Secondary | ICD-10-CM | POA: Diagnosis not present

## 2014-12-08 DIAGNOSIS — K120 Recurrent oral aphthae: Secondary | ICD-10-CM | POA: Diagnosis not present

## 2014-12-08 DIAGNOSIS — R053 Chronic cough: Secondary | ICD-10-CM

## 2014-12-08 DIAGNOSIS — R7309 Other abnormal glucose: Secondary | ICD-10-CM | POA: Diagnosis not present

## 2014-12-08 DIAGNOSIS — Z8639 Personal history of other endocrine, nutritional and metabolic disease: Secondary | ICD-10-CM | POA: Diagnosis not present

## 2014-12-12 ENCOUNTER — Ambulatory Visit
Admission: RE | Admit: 2014-12-12 | Discharge: 2014-12-12 | Disposition: A | Discharge status: Home / Self Care | Payer: Medicare Other | Source: Ambulatory Visit | Attending: Family Medicine | Admitting: Family Medicine

## 2014-12-12 DIAGNOSIS — R053 Chronic cough: Secondary | ICD-10-CM

## 2014-12-12 DIAGNOSIS — R1310 Dysphagia, unspecified: Secondary | ICD-10-CM | POA: Diagnosis not present

## 2014-12-12 DIAGNOSIS — R05 Cough: Secondary | ICD-10-CM

## 2015-05-02 DIAGNOSIS — H6122 Impacted cerumen, left ear: Secondary | ICD-10-CM | POA: Diagnosis not present

## 2015-05-02 DIAGNOSIS — I10 Essential (primary) hypertension: Secondary | ICD-10-CM | POA: Diagnosis not present

## 2015-05-02 DIAGNOSIS — H1010 Acute atopic conjunctivitis, unspecified eye: Secondary | ICD-10-CM | POA: Diagnosis not present

## 2015-05-02 DIAGNOSIS — J309 Allergic rhinitis, unspecified: Secondary | ICD-10-CM | POA: Diagnosis not present

## 2015-05-22 DIAGNOSIS — Z23 Encounter for immunization: Secondary | ICD-10-CM | POA: Diagnosis not present

## 2015-07-13 ENCOUNTER — Telehealth: Payer: Self-pay | Admitting: Oncology

## 2015-07-13 NOTE — Telephone Encounter (Signed)
07/13/15 - Called patient on the phone and spoke with her daughter Maureen [name].  She states her mother has not been in the hospital in the last year.  Her mother has not had any broken bones or fractures.  She does not take any medicine for her bones. She has not had a bone density test in the last year.  She had not had any cardiac or thrombotic events in the last year.  I told her I will give her a call again next year.   [name] 07/13/15 - 3:40 pm

## 2015-07-17 DIAGNOSIS — E119 Type 2 diabetes mellitus without complications: Secondary | ICD-10-CM | POA: Diagnosis not present

## 2015-08-14 ENCOUNTER — Other Ambulatory Visit: Payer: Self-pay

## 2015-08-14 DIAGNOSIS — Z1231 Encounter for screening mammogram for malignant neoplasm of breast: Secondary | ICD-10-CM

## 2015-09-21 ENCOUNTER — Ambulatory Visit
Admission: RE | Admit: 2015-09-21 | Discharge: 2015-09-21 | Disposition: A | Discharge status: Home / Self Care | Payer: Medicare Other | Source: Ambulatory Visit

## 2015-09-21 DIAGNOSIS — Z1231 Encounter for screening mammogram for malignant neoplasm of breast: Secondary | ICD-10-CM

## 2015-12-11 DIAGNOSIS — R5383 Other fatigue: Secondary | ICD-10-CM | POA: Diagnosis not present

## 2015-12-11 DIAGNOSIS — F419 Anxiety disorder, unspecified: Secondary | ICD-10-CM | POA: Diagnosis not present

## 2015-12-11 DIAGNOSIS — E7800 Pure hypercholesterolemia, unspecified: Secondary | ICD-10-CM | POA: Diagnosis not present

## 2015-12-11 DIAGNOSIS — I10 Essential (primary) hypertension: Secondary | ICD-10-CM | POA: Diagnosis not present

## 2015-12-11 DIAGNOSIS — K120 Recurrent oral aphthae: Secondary | ICD-10-CM | POA: Diagnosis not present

## 2015-12-11 DIAGNOSIS — R21 Rash and other nonspecific skin eruption: Secondary | ICD-10-CM | POA: Diagnosis not present

## 2015-12-11 DIAGNOSIS — R1310 Dysphagia, unspecified: Secondary | ICD-10-CM | POA: Diagnosis not present

## 2015-12-11 DIAGNOSIS — R7301 Impaired fasting glucose: Secondary | ICD-10-CM | POA: Diagnosis not present

## 2015-12-11 DIAGNOSIS — Z0000 Encounter for general adult medical examination without abnormal findings: Secondary | ICD-10-CM | POA: Diagnosis not present

## 2015-12-11 DIAGNOSIS — Z853 Personal history of malignant neoplasm of breast: Secondary | ICD-10-CM | POA: Diagnosis not present

## 2015-12-11 DIAGNOSIS — Z8639 Personal history of other endocrine, nutritional and metabolic disease: Secondary | ICD-10-CM | POA: Diagnosis not present

## 2015-12-11 DIAGNOSIS — M810 Age-related osteoporosis without current pathological fracture: Secondary | ICD-10-CM | POA: Diagnosis not present

## 2015-12-11 DIAGNOSIS — R7303 Prediabetes: Secondary | ICD-10-CM | POA: Diagnosis not present

## 2015-12-11 DIAGNOSIS — R05 Cough: Secondary | ICD-10-CM | POA: Diagnosis not present

## 2015-12-14 ENCOUNTER — Other Ambulatory Visit: Payer: Self-pay | Admitting: Family Medicine

## 2015-12-14 DIAGNOSIS — R21 Rash and other nonspecific skin eruption: Secondary | ICD-10-CM | POA: Diagnosis not present

## 2015-12-14 DIAGNOSIS — Z7189 Other specified counseling: Secondary | ICD-10-CM | POA: Diagnosis not present

## 2015-12-14 DIAGNOSIS — Z0000 Encounter for general adult medical examination without abnormal findings: Secondary | ICD-10-CM | POA: Diagnosis not present

## 2015-12-14 DIAGNOSIS — R7301 Impaired fasting glucose: Secondary | ICD-10-CM | POA: Diagnosis not present

## 2015-12-14 DIAGNOSIS — Z8719 Personal history of other diseases of the digestive system: Secondary | ICD-10-CM | POA: Diagnosis not present

## 2015-12-14 DIAGNOSIS — Z8669 Personal history of other diseases of the nervous system and sense organs: Secondary | ICD-10-CM | POA: Diagnosis not present

## 2015-12-14 DIAGNOSIS — J309 Allergic rhinitis, unspecified: Secondary | ICD-10-CM | POA: Diagnosis not present

## 2015-12-14 DIAGNOSIS — F419 Anxiety disorder, unspecified: Secondary | ICD-10-CM | POA: Diagnosis not present

## 2015-12-14 DIAGNOSIS — I10 Essential (primary) hypertension: Secondary | ICD-10-CM | POA: Diagnosis not present

## 2015-12-14 DIAGNOSIS — M8580 Other specified disorders of bone density and structure, unspecified site: Secondary | ICD-10-CM | POA: Diagnosis not present

## 2015-12-14 DIAGNOSIS — R05 Cough: Secondary | ICD-10-CM | POA: Diagnosis not present

## 2015-12-14 DIAGNOSIS — G479 Sleep disorder, unspecified: Secondary | ICD-10-CM | POA: Diagnosis not present

## 2016-01-01 ENCOUNTER — Ambulatory Visit
Admission: RE | Admit: 2016-01-01 | Discharge: 2016-01-01 | Disposition: A | Discharge status: Home / Self Care | Payer: Medicare Other | Source: Ambulatory Visit | Attending: Family Medicine | Admitting: Family Medicine

## 2016-01-01 DIAGNOSIS — Z780 Asymptomatic menopausal state: Secondary | ICD-10-CM | POA: Diagnosis not present

## 2016-01-01 DIAGNOSIS — M810 Age-related osteoporosis without current pathological fracture: Secondary | ICD-10-CM | POA: Diagnosis not present

## 2016-01-01 DIAGNOSIS — M8580 Other specified disorders of bone density and structure, unspecified site: Secondary | ICD-10-CM

## 2016-01-04 DIAGNOSIS — I10 Essential (primary) hypertension: Secondary | ICD-10-CM | POA: Diagnosis not present

## 2016-05-16 DIAGNOSIS — Z23 Encounter for immunization: Secondary | ICD-10-CM | POA: Diagnosis not present

## 2016-07-17 DIAGNOSIS — E119 Type 2 diabetes mellitus without complications: Secondary | ICD-10-CM | POA: Diagnosis not present

## 2016-08-20 ENCOUNTER — Other Ambulatory Visit: Payer: Self-pay | Admitting: Family Medicine

## 2016-08-20 DIAGNOSIS — Z1231 Encounter for screening mammogram for malignant neoplasm of breast: Secondary | ICD-10-CM

## 2016-09-23 ENCOUNTER — Ambulatory Visit
Admission: RE | Admit: 2016-09-23 | Discharge: 2016-09-23 | Disposition: A | Discharge status: Home / Self Care | Payer: Medicare Other | Source: Ambulatory Visit | Attending: Family Medicine | Admitting: Family Medicine

## 2016-09-23 DIAGNOSIS — Z1231 Encounter for screening mammogram for malignant neoplasm of breast: Secondary | ICD-10-CM

## 2016-12-16 DIAGNOSIS — R21 Rash and other nonspecific skin eruption: Secondary | ICD-10-CM | POA: Diagnosis not present

## 2016-12-16 DIAGNOSIS — I10 Essential (primary) hypertension: Secondary | ICD-10-CM | POA: Diagnosis not present

## 2016-12-16 DIAGNOSIS — M8580 Other specified disorders of bone density and structure, unspecified site: Secondary | ICD-10-CM | POA: Diagnosis not present

## 2016-12-16 DIAGNOSIS — Z8639 Personal history of other endocrine, nutritional and metabolic disease: Secondary | ICD-10-CM | POA: Diagnosis not present

## 2016-12-16 DIAGNOSIS — J309 Allergic rhinitis, unspecified: Secondary | ICD-10-CM | POA: Diagnosis not present

## 2016-12-16 DIAGNOSIS — G479 Sleep disorder, unspecified: Secondary | ICD-10-CM | POA: Diagnosis not present

## 2016-12-16 DIAGNOSIS — M810 Age-related osteoporosis without current pathological fracture: Secondary | ICD-10-CM | POA: Diagnosis not present

## 2016-12-16 DIAGNOSIS — Z0000 Encounter for general adult medical examination without abnormal findings: Secondary | ICD-10-CM | POA: Diagnosis not present

## 2016-12-16 DIAGNOSIS — F419 Anxiety disorder, unspecified: Secondary | ICD-10-CM | POA: Diagnosis not present

## 2016-12-16 DIAGNOSIS — R7303 Prediabetes: Secondary | ICD-10-CM | POA: Diagnosis not present

## 2016-12-16 DIAGNOSIS — Z8719 Personal history of other diseases of the digestive system: Secondary | ICD-10-CM | POA: Diagnosis not present

## 2016-12-16 DIAGNOSIS — Z853 Personal history of malignant neoplasm of breast: Secondary | ICD-10-CM | POA: Diagnosis not present

## 2016-12-19 DIAGNOSIS — G479 Sleep disorder, unspecified: Secondary | ICD-10-CM | POA: Diagnosis not present

## 2016-12-19 DIAGNOSIS — Z0000 Encounter for general adult medical examination without abnormal findings: Secondary | ICD-10-CM | POA: Diagnosis not present

## 2016-12-19 DIAGNOSIS — Z9189 Other specified personal risk factors, not elsewhere classified: Secondary | ICD-10-CM | POA: Diagnosis not present

## 2016-12-19 DIAGNOSIS — J309 Allergic rhinitis, unspecified: Secondary | ICD-10-CM | POA: Diagnosis not present

## 2016-12-27 DIAGNOSIS — M810 Age-related osteoporosis without current pathological fracture: Secondary | ICD-10-CM | POA: Diagnosis not present

## 2016-12-27 DIAGNOSIS — J309 Allergic rhinitis, unspecified: Secondary | ICD-10-CM | POA: Diagnosis not present

## 2016-12-27 DIAGNOSIS — F331 Major depressive disorder, recurrent, moderate: Secondary | ICD-10-CM | POA: Diagnosis not present

## 2016-12-27 DIAGNOSIS — F419 Anxiety disorder, unspecified: Secondary | ICD-10-CM | POA: Diagnosis not present

## 2016-12-27 DIAGNOSIS — Z853 Personal history of malignant neoplasm of breast: Secondary | ICD-10-CM | POA: Diagnosis not present

## 2016-12-27 DIAGNOSIS — E063 Autoimmune thyroiditis: Secondary | ICD-10-CM | POA: Diagnosis not present

## 2016-12-27 DIAGNOSIS — R7303 Prediabetes: Secondary | ICD-10-CM | POA: Diagnosis not present

## 2016-12-27 DIAGNOSIS — G479 Sleep disorder, unspecified: Secondary | ICD-10-CM | POA: Diagnosis not present

## 2016-12-27 DIAGNOSIS — I10 Essential (primary) hypertension: Secondary | ICD-10-CM | POA: Diagnosis not present

## 2017-02-25 DIAGNOSIS — J069 Acute upper respiratory infection, unspecified: Secondary | ICD-10-CM | POA: Diagnosis not present

## 2017-04-15 DIAGNOSIS — H1013 Acute atopic conjunctivitis, bilateral: Secondary | ICD-10-CM | POA: Diagnosis not present

## 2017-04-22 DIAGNOSIS — H1013 Acute atopic conjunctivitis, bilateral: Secondary | ICD-10-CM | POA: Diagnosis not present

## 2017-05-16 DIAGNOSIS — Z23 Encounter for immunization: Secondary | ICD-10-CM | POA: Diagnosis not present

## 2017-06-30 DIAGNOSIS — I10 Essential (primary) hypertension: Secondary | ICD-10-CM | POA: Diagnosis not present

## 2017-06-30 DIAGNOSIS — J309 Allergic rhinitis, unspecified: Secondary | ICD-10-CM | POA: Diagnosis not present

## 2017-06-30 DIAGNOSIS — M810 Age-related osteoporosis without current pathological fracture: Secondary | ICD-10-CM | POA: Diagnosis not present

## 2017-06-30 DIAGNOSIS — F331 Major depressive disorder, recurrent, moderate: Secondary | ICD-10-CM | POA: Diagnosis not present

## 2017-06-30 DIAGNOSIS — G479 Sleep disorder, unspecified: Secondary | ICD-10-CM | POA: Diagnosis not present

## 2017-07-04 DIAGNOSIS — H1131 Conjunctival hemorrhage, right eye: Secondary | ICD-10-CM | POA: Diagnosis not present

## 2017-07-21 DIAGNOSIS — E119 Type 2 diabetes mellitus without complications: Secondary | ICD-10-CM | POA: Diagnosis not present

## 2017-08-18 ENCOUNTER — Other Ambulatory Visit: Payer: Self-pay | Admitting: Family Medicine

## 2017-08-18 DIAGNOSIS — Z1231 Encounter for screening mammogram for malignant neoplasm of breast: Secondary | ICD-10-CM

## 2017-09-24 ENCOUNTER — Ambulatory Visit
Admission: RE | Admit: 2017-09-24 | Discharge: 2017-09-24 | Disposition: A | Discharge status: Home / Self Care | Payer: Medicare Other | Source: Ambulatory Visit | Attending: Family Medicine | Admitting: Family Medicine

## 2017-09-24 DIAGNOSIS — Z1231 Encounter for screening mammogram for malignant neoplasm of breast: Secondary | ICD-10-CM

## 2017-12-26 DIAGNOSIS — I10 Essential (primary) hypertension: Secondary | ICD-10-CM | POA: Diagnosis not present

## 2017-12-26 DIAGNOSIS — M810 Age-related osteoporosis without current pathological fracture: Secondary | ICD-10-CM | POA: Diagnosis not present

## 2017-12-26 DIAGNOSIS — G479 Sleep disorder, unspecified: Secondary | ICD-10-CM | POA: Diagnosis not present

## 2017-12-26 DIAGNOSIS — F3340 Major depressive disorder, recurrent, in remission, unspecified: Secondary | ICD-10-CM | POA: Diagnosis not present

## 2017-12-26 DIAGNOSIS — Z780 Asymptomatic menopausal state: Secondary | ICD-10-CM | POA: Diagnosis not present

## 2017-12-26 DIAGNOSIS — Z8639 Personal history of other endocrine, nutritional and metabolic disease: Secondary | ICD-10-CM | POA: Diagnosis not present

## 2017-12-26 DIAGNOSIS — Z0000 Encounter for general adult medical examination without abnormal findings: Secondary | ICD-10-CM | POA: Diagnosis not present

## 2017-12-26 DIAGNOSIS — Z1389 Encounter for screening for other disorder: Secondary | ICD-10-CM | POA: Diagnosis not present

## 2017-12-26 DIAGNOSIS — Z853 Personal history of malignant neoplasm of breast: Secondary | ICD-10-CM | POA: Diagnosis not present

## 2017-12-26 DIAGNOSIS — R7303 Prediabetes: Secondary | ICD-10-CM | POA: Diagnosis not present

## 2017-12-26 DIAGNOSIS — Z681 Body mass index (BMI) 19 or less, adult: Secondary | ICD-10-CM | POA: Diagnosis not present

## 2017-12-26 DIAGNOSIS — L309 Dermatitis, unspecified: Secondary | ICD-10-CM | POA: Diagnosis not present

## 2017-12-26 DIAGNOSIS — J309 Allergic rhinitis, unspecified: Secondary | ICD-10-CM | POA: Diagnosis not present

## 2017-12-29 ENCOUNTER — Other Ambulatory Visit: Payer: Self-pay | Admitting: Family Medicine

## 2017-12-29 DIAGNOSIS — M810 Age-related osteoporosis without current pathological fracture: Secondary | ICD-10-CM

## 2017-12-29 DIAGNOSIS — Z139 Encounter for screening, unspecified: Secondary | ICD-10-CM

## 2018-02-06 ENCOUNTER — Ambulatory Visit: Payer: Self-pay

## 2018-02-06 ENCOUNTER — Ambulatory Visit
Admission: RE | Admit: 2018-02-06 | Discharge: 2018-02-06 | Disposition: A | Discharge status: Home / Self Care | Payer: Medicare Other | Source: Ambulatory Visit | Attending: Family Medicine | Admitting: Family Medicine

## 2018-02-06 DIAGNOSIS — M810 Age-related osteoporosis without current pathological fracture: Secondary | ICD-10-CM

## 2018-02-06 DIAGNOSIS — M8589 Other specified disorders of bone density and structure, multiple sites: Secondary | ICD-10-CM | POA: Diagnosis not present

## 2018-02-06 DIAGNOSIS — Z780 Asymptomatic menopausal state: Secondary | ICD-10-CM | POA: Diagnosis not present

## 2018-05-23 DIAGNOSIS — Z23 Encounter for immunization: Secondary | ICD-10-CM | POA: Diagnosis not present

## 2018-07-02 DIAGNOSIS — M810 Age-related osteoporosis without current pathological fracture: Secondary | ICD-10-CM | POA: Diagnosis not present

## 2018-07-02 DIAGNOSIS — G479 Sleep disorder, unspecified: Secondary | ICD-10-CM | POA: Diagnosis not present

## 2018-07-02 DIAGNOSIS — I10 Essential (primary) hypertension: Secondary | ICD-10-CM | POA: Diagnosis not present

## 2018-07-02 DIAGNOSIS — F3340 Major depressive disorder, recurrent, in remission, unspecified: Secondary | ICD-10-CM | POA: Diagnosis not present

## 2018-07-02 DIAGNOSIS — G43909 Migraine, unspecified, not intractable, without status migrainosus: Secondary | ICD-10-CM | POA: Diagnosis not present

## 2018-07-02 DIAGNOSIS — J309 Allergic rhinitis, unspecified: Secondary | ICD-10-CM | POA: Diagnosis not present

## 2018-07-02 DIAGNOSIS — F419 Anxiety disorder, unspecified: Secondary | ICD-10-CM | POA: Diagnosis not present

## 2018-07-22 DIAGNOSIS — E119 Type 2 diabetes mellitus without complications: Secondary | ICD-10-CM | POA: Diagnosis not present

## 2018-08-14 ENCOUNTER — Other Ambulatory Visit: Payer: Self-pay | Admitting: Family Medicine

## 2018-08-14 DIAGNOSIS — Z1231 Encounter for screening mammogram for malignant neoplasm of breast: Secondary | ICD-10-CM

## 2018-09-25 ENCOUNTER — Ambulatory Visit
Admission: RE | Admit: 2018-09-25 | Discharge: 2018-09-25 | Disposition: A | Discharge status: Home / Self Care | Payer: Medicare Other | Source: Ambulatory Visit | Attending: Family Medicine | Admitting: Family Medicine

## 2018-09-25 DIAGNOSIS — Z1231 Encounter for screening mammogram for malignant neoplasm of breast: Secondary | ICD-10-CM

## 2019-01-04 DIAGNOSIS — Z8639 Personal history of other endocrine, nutritional and metabolic disease: Secondary | ICD-10-CM | POA: Diagnosis not present

## 2019-01-04 DIAGNOSIS — I10 Essential (primary) hypertension: Secondary | ICD-10-CM | POA: Diagnosis not present

## 2019-01-04 DIAGNOSIS — Z853 Personal history of malignant neoplasm of breast: Secondary | ICD-10-CM | POA: Diagnosis not present

## 2019-01-04 DIAGNOSIS — F3340 Major depressive disorder, recurrent, in remission, unspecified: Secondary | ICD-10-CM | POA: Diagnosis not present

## 2019-01-04 DIAGNOSIS — J309 Allergic rhinitis, unspecified: Secondary | ICD-10-CM | POA: Diagnosis not present

## 2019-01-04 DIAGNOSIS — M810 Age-related osteoporosis without current pathological fracture: Secondary | ICD-10-CM | POA: Diagnosis not present

## 2019-01-04 DIAGNOSIS — G43909 Migraine, unspecified, not intractable, without status migrainosus: Secondary | ICD-10-CM | POA: Diagnosis not present

## 2019-01-04 DIAGNOSIS — Z0000 Encounter for general adult medical examination without abnormal findings: Secondary | ICD-10-CM | POA: Diagnosis not present

## 2019-01-04 DIAGNOSIS — G479 Sleep disorder, unspecified: Secondary | ICD-10-CM | POA: Diagnosis not present

## 2019-01-04 DIAGNOSIS — M79672 Pain in left foot: Secondary | ICD-10-CM | POA: Diagnosis not present

## 2019-01-04 DIAGNOSIS — F419 Anxiety disorder, unspecified: Secondary | ICD-10-CM | POA: Diagnosis not present

## 2019-01-07 DIAGNOSIS — M810 Age-related osteoporosis without current pathological fracture: Secondary | ICD-10-CM | POA: Diagnosis not present

## 2019-01-07 DIAGNOSIS — F3340 Major depressive disorder, recurrent, in remission, unspecified: Secondary | ICD-10-CM | POA: Diagnosis not present

## 2019-01-07 DIAGNOSIS — Z8639 Personal history of other endocrine, nutritional and metabolic disease: Secondary | ICD-10-CM | POA: Diagnosis not present

## 2019-01-07 DIAGNOSIS — R7303 Prediabetes: Secondary | ICD-10-CM | POA: Diagnosis not present

## 2019-01-07 DIAGNOSIS — L309 Dermatitis, unspecified: Secondary | ICD-10-CM | POA: Diagnosis not present

## 2019-01-07 DIAGNOSIS — Z853 Personal history of malignant neoplasm of breast: Secondary | ICD-10-CM | POA: Diagnosis not present

## 2019-01-07 DIAGNOSIS — J309 Allergic rhinitis, unspecified: Secondary | ICD-10-CM | POA: Diagnosis not present

## 2019-01-07 DIAGNOSIS — Z681 Body mass index (BMI) 19 or less, adult: Secondary | ICD-10-CM | POA: Diagnosis not present

## 2019-01-07 DIAGNOSIS — I10 Essential (primary) hypertension: Secondary | ICD-10-CM | POA: Diagnosis not present

## 2019-01-07 DIAGNOSIS — G479 Sleep disorder, unspecified: Secondary | ICD-10-CM | POA: Diagnosis not present

## 2019-01-07 DIAGNOSIS — Z0000 Encounter for general adult medical examination without abnormal findings: Secondary | ICD-10-CM | POA: Diagnosis not present

## 2019-02-25 DIAGNOSIS — R300 Dysuria: Secondary | ICD-10-CM | POA: Diagnosis not present

## 2019-04-12 DIAGNOSIS — Z23 Encounter for immunization: Secondary | ICD-10-CM | POA: Diagnosis not present

## 2019-04-21 DIAGNOSIS — I10 Essential (primary) hypertension: Secondary | ICD-10-CM | POA: Diagnosis not present

## 2019-04-21 DIAGNOSIS — M810 Age-related osteoporosis without current pathological fracture: Secondary | ICD-10-CM | POA: Diagnosis not present

## 2019-04-21 DIAGNOSIS — F3340 Major depressive disorder, recurrent, in remission, unspecified: Secondary | ICD-10-CM | POA: Diagnosis not present

## 2019-04-21 DIAGNOSIS — Z853 Personal history of malignant neoplasm of breast: Secondary | ICD-10-CM | POA: Diagnosis not present

## 2019-04-21 DIAGNOSIS — F331 Major depressive disorder, recurrent, moderate: Secondary | ICD-10-CM | POA: Diagnosis not present

## 2019-07-09 DIAGNOSIS — R809 Proteinuria, unspecified: Secondary | ICD-10-CM | POA: Diagnosis not present

## 2019-07-13 DIAGNOSIS — F3340 Major depressive disorder, recurrent, in remission, unspecified: Secondary | ICD-10-CM | POA: Diagnosis not present

## 2019-07-13 DIAGNOSIS — R7303 Prediabetes: Secondary | ICD-10-CM | POA: Diagnosis not present

## 2019-07-13 DIAGNOSIS — Z853 Personal history of malignant neoplasm of breast: Secondary | ICD-10-CM | POA: Diagnosis not present

## 2019-07-13 DIAGNOSIS — F419 Anxiety disorder, unspecified: Secondary | ICD-10-CM | POA: Diagnosis not present

## 2019-07-13 DIAGNOSIS — I10 Essential (primary) hypertension: Secondary | ICD-10-CM | POA: Diagnosis not present

## 2019-07-13 DIAGNOSIS — E063 Autoimmune thyroiditis: Secondary | ICD-10-CM | POA: Diagnosis not present

## 2019-07-13 DIAGNOSIS — F331 Major depressive disorder, recurrent, moderate: Secondary | ICD-10-CM | POA: Diagnosis not present

## 2019-07-13 DIAGNOSIS — F5101 Primary insomnia: Secondary | ICD-10-CM | POA: Diagnosis not present

## 2019-07-13 DIAGNOSIS — M810 Age-related osteoporosis without current pathological fracture: Secondary | ICD-10-CM | POA: Diagnosis not present

## 2019-08-18 ENCOUNTER — Other Ambulatory Visit: Payer: Self-pay | Admitting: Family Medicine

## 2019-08-18 DIAGNOSIS — Z1231 Encounter for screening mammogram for malignant neoplasm of breast: Secondary | ICD-10-CM

## 2019-10-04 ENCOUNTER — Other Ambulatory Visit: Payer: Self-pay

## 2019-10-04 ENCOUNTER — Ambulatory Visit
Admission: RE | Admit: 2019-10-04 | Discharge: 2019-10-04 | Disposition: A | Discharge status: Home / Self Care | Payer: Medicare Other | Source: Ambulatory Visit | Attending: Family Medicine | Admitting: Family Medicine

## 2019-10-04 DIAGNOSIS — Z1231 Encounter for screening mammogram for malignant neoplasm of breast: Secondary | ICD-10-CM

## 2019-10-20 DIAGNOSIS — M810 Age-related osteoporosis without current pathological fracture: Secondary | ICD-10-CM | POA: Diagnosis not present

## 2019-10-20 DIAGNOSIS — Z853 Personal history of malignant neoplasm of breast: Secondary | ICD-10-CM | POA: Diagnosis not present

## 2019-10-20 DIAGNOSIS — F3340 Major depressive disorder, recurrent, in remission, unspecified: Secondary | ICD-10-CM | POA: Diagnosis not present

## 2019-10-20 DIAGNOSIS — F331 Major depressive disorder, recurrent, moderate: Secondary | ICD-10-CM | POA: Diagnosis not present

## 2019-10-20 DIAGNOSIS — I10 Essential (primary) hypertension: Secondary | ICD-10-CM | POA: Diagnosis not present

## 2020-01-04 DIAGNOSIS — F419 Anxiety disorder, unspecified: Secondary | ICD-10-CM | POA: Diagnosis not present

## 2020-01-04 DIAGNOSIS — I10 Essential (primary) hypertension: Secondary | ICD-10-CM | POA: Diagnosis not present

## 2020-01-04 DIAGNOSIS — R7309 Other abnormal glucose: Secondary | ICD-10-CM | POA: Diagnosis not present

## 2020-01-04 DIAGNOSIS — Z853 Personal history of malignant neoplasm of breast: Secondary | ICD-10-CM | POA: Diagnosis not present

## 2020-01-04 DIAGNOSIS — F5101 Primary insomnia: Secondary | ICD-10-CM | POA: Diagnosis not present

## 2020-01-04 DIAGNOSIS — F3340 Major depressive disorder, recurrent, in remission, unspecified: Secondary | ICD-10-CM | POA: Diagnosis not present

## 2020-01-04 DIAGNOSIS — M810 Age-related osteoporosis without current pathological fracture: Secondary | ICD-10-CM | POA: Diagnosis not present

## 2020-01-04 DIAGNOSIS — E063 Autoimmune thyroiditis: Secondary | ICD-10-CM | POA: Diagnosis not present

## 2020-01-10 DIAGNOSIS — I10 Essential (primary) hypertension: Secondary | ICD-10-CM | POA: Diagnosis not present

## 2020-01-10 DIAGNOSIS — F5101 Primary insomnia: Secondary | ICD-10-CM | POA: Diagnosis not present

## 2020-01-10 DIAGNOSIS — L309 Dermatitis, unspecified: Secondary | ICD-10-CM | POA: Diagnosis not present

## 2020-01-10 DIAGNOSIS — M810 Age-related osteoporosis without current pathological fracture: Secondary | ICD-10-CM | POA: Diagnosis not present

## 2020-01-10 DIAGNOSIS — Z01419 Encounter for gynecological examination (general) (routine) without abnormal findings: Secondary | ICD-10-CM | POA: Diagnosis not present

## 2020-01-10 DIAGNOSIS — F3340 Major depressive disorder, recurrent, in remission, unspecified: Secondary | ICD-10-CM | POA: Diagnosis not present

## 2020-01-10 DIAGNOSIS — F331 Major depressive disorder, recurrent, moderate: Secondary | ICD-10-CM | POA: Diagnosis not present

## 2020-01-10 DIAGNOSIS — R7309 Other abnormal glucose: Secondary | ICD-10-CM | POA: Diagnosis not present

## 2020-01-10 DIAGNOSIS — Z853 Personal history of malignant neoplasm of breast: Secondary | ICD-10-CM | POA: Diagnosis not present

## 2020-01-10 DIAGNOSIS — F419 Anxiety disorder, unspecified: Secondary | ICD-10-CM | POA: Diagnosis not present

## 2020-01-10 DIAGNOSIS — Z0000 Encounter for general adult medical examination without abnormal findings: Secondary | ICD-10-CM | POA: Diagnosis not present

## 2020-01-10 DIAGNOSIS — E063 Autoimmune thyroiditis: Secondary | ICD-10-CM | POA: Diagnosis not present

## 2020-01-13 ENCOUNTER — Other Ambulatory Visit: Payer: Self-pay | Admitting: Family Medicine

## 2020-01-13 DIAGNOSIS — M810 Age-related osteoporosis without current pathological fracture: Secondary | ICD-10-CM

## 2020-03-24 DIAGNOSIS — M810 Age-related osteoporosis without current pathological fracture: Secondary | ICD-10-CM | POA: Diagnosis not present

## 2020-03-24 DIAGNOSIS — F3340 Major depressive disorder, recurrent, in remission, unspecified: Secondary | ICD-10-CM | POA: Diagnosis not present

## 2020-03-24 DIAGNOSIS — I10 Essential (primary) hypertension: Secondary | ICD-10-CM | POA: Diagnosis not present

## 2020-03-24 DIAGNOSIS — F331 Major depressive disorder, recurrent, moderate: Secondary | ICD-10-CM | POA: Diagnosis not present

## 2020-03-24 DIAGNOSIS — Z853 Personal history of malignant neoplasm of breast: Secondary | ICD-10-CM | POA: Diagnosis not present

## 2020-04-17 ENCOUNTER — Ambulatory Visit
Admission: RE | Admit: 2020-04-17 | Discharge: 2020-04-17 | Disposition: A | Discharge status: Home / Self Care | Payer: Medicare Other | Source: Ambulatory Visit | Attending: Family Medicine | Admitting: Family Medicine

## 2020-04-17 ENCOUNTER — Other Ambulatory Visit: Payer: Self-pay

## 2020-04-17 DIAGNOSIS — Z780 Asymptomatic menopausal state: Secondary | ICD-10-CM | POA: Diagnosis not present

## 2020-04-17 DIAGNOSIS — M8589 Other specified disorders of bone density and structure, multiple sites: Secondary | ICD-10-CM | POA: Diagnosis not present

## 2020-04-17 DIAGNOSIS — M810 Age-related osteoporosis without current pathological fracture: Secondary | ICD-10-CM

## 2020-05-17 DIAGNOSIS — Z23 Encounter for immunization: Secondary | ICD-10-CM | POA: Diagnosis not present

## 2020-05-25 DIAGNOSIS — Z853 Personal history of malignant neoplasm of breast: Secondary | ICD-10-CM | POA: Diagnosis not present

## 2020-05-25 DIAGNOSIS — M810 Age-related osteoporosis without current pathological fracture: Secondary | ICD-10-CM | POA: Diagnosis not present

## 2020-05-25 DIAGNOSIS — F331 Major depressive disorder, recurrent, moderate: Secondary | ICD-10-CM | POA: Diagnosis not present

## 2020-05-25 DIAGNOSIS — I10 Essential (primary) hypertension: Secondary | ICD-10-CM | POA: Diagnosis not present

## 2020-05-25 DIAGNOSIS — F3340 Major depressive disorder, recurrent, in remission, unspecified: Secondary | ICD-10-CM | POA: Diagnosis not present

## 2020-06-07 DIAGNOSIS — Z23 Encounter for immunization: Secondary | ICD-10-CM | POA: Diagnosis not present

## 2020-06-28 DIAGNOSIS — F331 Major depressive disorder, recurrent, moderate: Secondary | ICD-10-CM | POA: Diagnosis not present

## 2020-06-28 DIAGNOSIS — I10 Essential (primary) hypertension: Secondary | ICD-10-CM | POA: Diagnosis not present

## 2020-06-28 DIAGNOSIS — F3340 Major depressive disorder, recurrent, in remission, unspecified: Secondary | ICD-10-CM | POA: Diagnosis not present

## 2020-06-28 DIAGNOSIS — M810 Age-related osteoporosis without current pathological fracture: Secondary | ICD-10-CM | POA: Diagnosis not present

## 2020-06-28 DIAGNOSIS — Z853 Personal history of malignant neoplasm of breast: Secondary | ICD-10-CM | POA: Diagnosis not present

## 2020-07-14 DIAGNOSIS — M810 Age-related osteoporosis without current pathological fracture: Secondary | ICD-10-CM | POA: Diagnosis not present

## 2020-07-14 DIAGNOSIS — R7309 Other abnormal glucose: Secondary | ICD-10-CM | POA: Diagnosis not present

## 2020-07-14 DIAGNOSIS — Z853 Personal history of malignant neoplasm of breast: Secondary | ICD-10-CM | POA: Diagnosis not present

## 2020-07-14 DIAGNOSIS — F5101 Primary insomnia: Secondary | ICD-10-CM | POA: Diagnosis not present

## 2020-07-14 DIAGNOSIS — I10 Essential (primary) hypertension: Secondary | ICD-10-CM | POA: Diagnosis not present

## 2020-07-14 DIAGNOSIS — R21 Rash and other nonspecific skin eruption: Secondary | ICD-10-CM | POA: Diagnosis not present

## 2020-07-14 DIAGNOSIS — F3340 Major depressive disorder, recurrent, in remission, unspecified: Secondary | ICD-10-CM | POA: Diagnosis not present

## 2020-07-14 DIAGNOSIS — J309 Allergic rhinitis, unspecified: Secondary | ICD-10-CM | POA: Diagnosis not present

## 2020-07-14 DIAGNOSIS — F331 Major depressive disorder, recurrent, moderate: Secondary | ICD-10-CM | POA: Diagnosis not present

## 2020-07-26 ENCOUNTER — Other Ambulatory Visit: Payer: Self-pay

## 2020-07-26 ENCOUNTER — Ambulatory Visit
Admission: RE | Admit: 2020-07-26 | Discharge: 2020-07-26 | Disposition: A | Discharge status: Home / Self Care | Payer: Medicare Other | Source: Ambulatory Visit | Attending: Dermatology | Admitting: Dermatology

## 2020-07-26 ENCOUNTER — Other Ambulatory Visit: Payer: Self-pay | Admitting: Dermatology

## 2020-07-26 DIAGNOSIS — L603 Nail dystrophy: Secondary | ICD-10-CM

## 2020-07-26 DIAGNOSIS — M19041 Primary osteoarthritis, right hand: Secondary | ICD-10-CM | POA: Diagnosis not present

## 2020-07-26 DIAGNOSIS — L818 Other specified disorders of pigmentation: Secondary | ICD-10-CM | POA: Diagnosis not present

## 2020-08-17 DIAGNOSIS — E119 Type 2 diabetes mellitus without complications: Secondary | ICD-10-CM | POA: Diagnosis not present

## 2020-08-17 DIAGNOSIS — H5203 Hypermetropia, bilateral: Secondary | ICD-10-CM | POA: Diagnosis not present

## 2020-08-24 ENCOUNTER — Other Ambulatory Visit: Payer: Self-pay | Admitting: Family Medicine

## 2020-08-24 DIAGNOSIS — Z1231 Encounter for screening mammogram for malignant neoplasm of breast: Secondary | ICD-10-CM

## 2020-08-28 DIAGNOSIS — F331 Major depressive disorder, recurrent, moderate: Secondary | ICD-10-CM | POA: Diagnosis not present

## 2020-08-28 DIAGNOSIS — F3340 Major depressive disorder, recurrent, in remission, unspecified: Secondary | ICD-10-CM | POA: Diagnosis not present

## 2020-08-28 DIAGNOSIS — I10 Essential (primary) hypertension: Secondary | ICD-10-CM | POA: Diagnosis not present

## 2020-08-28 DIAGNOSIS — Z853 Personal history of malignant neoplasm of breast: Secondary | ICD-10-CM | POA: Diagnosis not present

## 2020-08-28 DIAGNOSIS — M810 Age-related osteoporosis without current pathological fracture: Secondary | ICD-10-CM | POA: Diagnosis not present

## 2020-10-05 ENCOUNTER — Ambulatory Visit
Admission: RE | Admit: 2020-10-05 | Discharge: 2020-10-05 | Disposition: A | Discharge status: Home / Self Care | Payer: Medicare Other | Source: Ambulatory Visit | Attending: Family Medicine | Admitting: Family Medicine

## 2020-10-05 ENCOUNTER — Other Ambulatory Visit: Payer: Self-pay

## 2020-10-05 DIAGNOSIS — Z1231 Encounter for screening mammogram for malignant neoplasm of breast: Secondary | ICD-10-CM

## 2020-10-27 DIAGNOSIS — F3340 Major depressive disorder, recurrent, in remission, unspecified: Secondary | ICD-10-CM | POA: Diagnosis not present

## 2020-10-27 DIAGNOSIS — F331 Major depressive disorder, recurrent, moderate: Secondary | ICD-10-CM | POA: Diagnosis not present

## 2020-10-27 DIAGNOSIS — I10 Essential (primary) hypertension: Secondary | ICD-10-CM | POA: Diagnosis not present

## 2020-10-27 DIAGNOSIS — M810 Age-related osteoporosis without current pathological fracture: Secondary | ICD-10-CM | POA: Diagnosis not present

## 2020-10-27 DIAGNOSIS — Z853 Personal history of malignant neoplasm of breast: Secondary | ICD-10-CM | POA: Diagnosis not present

## 2021-01-09 DIAGNOSIS — R7309 Other abnormal glucose: Secondary | ICD-10-CM | POA: Diagnosis not present

## 2021-01-09 DIAGNOSIS — I10 Essential (primary) hypertension: Secondary | ICD-10-CM | POA: Diagnosis not present

## 2021-01-09 DIAGNOSIS — L309 Dermatitis, unspecified: Secondary | ICD-10-CM | POA: Diagnosis not present

## 2021-01-09 DIAGNOSIS — E063 Autoimmune thyroiditis: Secondary | ICD-10-CM | POA: Diagnosis not present

## 2021-01-09 DIAGNOSIS — M810 Age-related osteoporosis without current pathological fracture: Secondary | ICD-10-CM | POA: Diagnosis not present

## 2021-01-09 DIAGNOSIS — F3340 Major depressive disorder, recurrent, in remission, unspecified: Secondary | ICD-10-CM | POA: Diagnosis not present

## 2021-01-09 DIAGNOSIS — Z0000 Encounter for general adult medical examination without abnormal findings: Secondary | ICD-10-CM | POA: Diagnosis not present

## 2021-01-09 DIAGNOSIS — Z853 Personal history of malignant neoplasm of breast: Secondary | ICD-10-CM | POA: Diagnosis not present

## 2021-01-09 DIAGNOSIS — F5101 Primary insomnia: Secondary | ICD-10-CM | POA: Diagnosis not present

## 2021-01-09 DIAGNOSIS — R7301 Impaired fasting glucose: Secondary | ICD-10-CM | POA: Diagnosis not present

## 2021-01-09 DIAGNOSIS — F419 Anxiety disorder, unspecified: Secondary | ICD-10-CM | POA: Diagnosis not present

## 2021-01-10 DIAGNOSIS — Z853 Personal history of malignant neoplasm of breast: Secondary | ICD-10-CM | POA: Diagnosis not present

## 2021-01-10 DIAGNOSIS — F331 Major depressive disorder, recurrent, moderate: Secondary | ICD-10-CM | POA: Diagnosis not present

## 2021-01-10 DIAGNOSIS — M810 Age-related osteoporosis without current pathological fracture: Secondary | ICD-10-CM | POA: Diagnosis not present

## 2021-01-10 DIAGNOSIS — F3340 Major depressive disorder, recurrent, in remission, unspecified: Secondary | ICD-10-CM | POA: Diagnosis not present

## 2021-01-10 DIAGNOSIS — I10 Essential (primary) hypertension: Secondary | ICD-10-CM | POA: Diagnosis not present

## 2021-01-11 DIAGNOSIS — I10 Essential (primary) hypertension: Secondary | ICD-10-CM | POA: Diagnosis not present

## 2021-01-11 DIAGNOSIS — F331 Major depressive disorder, recurrent, moderate: Secondary | ICD-10-CM | POA: Diagnosis not present

## 2021-01-11 DIAGNOSIS — F5101 Primary insomnia: Secondary | ICD-10-CM | POA: Diagnosis not present

## 2021-01-11 DIAGNOSIS — Z0000 Encounter for general adult medical examination without abnormal findings: Secondary | ICD-10-CM | POA: Diagnosis not present

## 2021-01-11 DIAGNOSIS — M810 Age-related osteoporosis without current pathological fracture: Secondary | ICD-10-CM | POA: Diagnosis not present

## 2021-01-11 DIAGNOSIS — R801 Persistent proteinuria, unspecified: Secondary | ICD-10-CM | POA: Diagnosis not present

## 2021-01-11 DIAGNOSIS — R21 Rash and other nonspecific skin eruption: Secondary | ICD-10-CM | POA: Diagnosis not present

## 2021-01-11 DIAGNOSIS — J309 Allergic rhinitis, unspecified: Secondary | ICD-10-CM | POA: Diagnosis not present

## 2021-01-15 DIAGNOSIS — Z23 Encounter for immunization: Secondary | ICD-10-CM | POA: Diagnosis not present

## 2021-02-21 DIAGNOSIS — M25539 Pain in unspecified wrist: Secondary | ICD-10-CM | POA: Diagnosis not present

## 2021-04-02 DIAGNOSIS — L308 Other specified dermatitis: Secondary | ICD-10-CM | POA: Diagnosis not present

## 2021-04-16 DIAGNOSIS — L308 Other specified dermatitis: Secondary | ICD-10-CM | POA: Diagnosis not present

## 2021-05-16 DIAGNOSIS — Z23 Encounter for immunization: Secondary | ICD-10-CM | POA: Diagnosis not present

## 2021-06-04 DIAGNOSIS — Z23 Encounter for immunization: Secondary | ICD-10-CM | POA: Diagnosis not present

## 2021-07-17 DIAGNOSIS — J309 Allergic rhinitis, unspecified: Secondary | ICD-10-CM | POA: Diagnosis not present

## 2021-07-17 DIAGNOSIS — Z8639 Personal history of other endocrine, nutritional and metabolic disease: Secondary | ICD-10-CM | POA: Diagnosis not present

## 2021-07-17 DIAGNOSIS — R801 Persistent proteinuria, unspecified: Secondary | ICD-10-CM | POA: Diagnosis not present

## 2021-07-17 DIAGNOSIS — M1812 Unilateral primary osteoarthritis of first carpometacarpal joint, left hand: Secondary | ICD-10-CM | POA: Diagnosis not present

## 2021-07-17 DIAGNOSIS — M810 Age-related osteoporosis without current pathological fracture: Secondary | ICD-10-CM | POA: Diagnosis not present

## 2021-07-17 DIAGNOSIS — L309 Dermatitis, unspecified: Secondary | ICD-10-CM | POA: Diagnosis not present

## 2021-07-17 DIAGNOSIS — F331 Major depressive disorder, recurrent, moderate: Secondary | ICD-10-CM | POA: Diagnosis not present

## 2021-07-17 DIAGNOSIS — R7309 Other abnormal glucose: Secondary | ICD-10-CM | POA: Diagnosis not present

## 2021-07-17 DIAGNOSIS — F5101 Primary insomnia: Secondary | ICD-10-CM | POA: Diagnosis not present

## 2021-07-17 DIAGNOSIS — Z13220 Encounter for screening for lipoid disorders: Secondary | ICD-10-CM | POA: Diagnosis not present

## 2021-07-17 DIAGNOSIS — I10 Essential (primary) hypertension: Secondary | ICD-10-CM | POA: Diagnosis not present

## 2021-08-08 DIAGNOSIS — R059 Cough, unspecified: Secondary | ICD-10-CM | POA: Diagnosis not present

## 2021-08-08 DIAGNOSIS — U071 COVID-19: Secondary | ICD-10-CM | POA: Diagnosis not present

## 2021-08-22 DIAGNOSIS — E119 Type 2 diabetes mellitus without complications: Secondary | ICD-10-CM | POA: Diagnosis not present

## 2021-08-22 DIAGNOSIS — H5203 Hypermetropia, bilateral: Secondary | ICD-10-CM | POA: Diagnosis not present

## 2021-08-22 DIAGNOSIS — H25813 Combined forms of age-related cataract, bilateral: Secondary | ICD-10-CM | POA: Diagnosis not present

## 2021-08-25 DIAGNOSIS — M810 Age-related osteoporosis without current pathological fracture: Secondary | ICD-10-CM | POA: Diagnosis not present

## 2021-08-25 DIAGNOSIS — F331 Major depressive disorder, recurrent, moderate: Secondary | ICD-10-CM | POA: Diagnosis not present

## 2021-08-25 DIAGNOSIS — I10 Essential (primary) hypertension: Secondary | ICD-10-CM | POA: Diagnosis not present

## 2021-08-25 DIAGNOSIS — M1812 Unilateral primary osteoarthritis of first carpometacarpal joint, left hand: Secondary | ICD-10-CM | POA: Diagnosis not present

## 2021-08-25 DIAGNOSIS — M1990 Unspecified osteoarthritis, unspecified site: Secondary | ICD-10-CM | POA: Diagnosis not present

## 2021-08-28 ENCOUNTER — Other Ambulatory Visit: Payer: Self-pay | Admitting: Family Medicine

## 2021-08-28 DIAGNOSIS — Z1231 Encounter for screening mammogram for malignant neoplasm of breast: Secondary | ICD-10-CM

## 2021-10-08 ENCOUNTER — Ambulatory Visit: Payer: Medicare Other

## 2021-10-21 DIAGNOSIS — Z20822 Contact with and (suspected) exposure to covid-19: Secondary | ICD-10-CM | POA: Diagnosis not present

## 2021-10-23 ENCOUNTER — Ambulatory Visit
Admission: RE | Admit: 2021-10-23 | Discharge: 2021-10-23 | Disposition: A | Discharge status: Home / Self Care | Payer: Medicare Other | Source: Ambulatory Visit | Attending: Family Medicine | Admitting: Family Medicine

## 2021-10-23 DIAGNOSIS — Z1231 Encounter for screening mammogram for malignant neoplasm of breast: Secondary | ICD-10-CM

## 2021-11-26 DIAGNOSIS — H10503 Unspecified blepharoconjunctivitis, bilateral: Secondary | ICD-10-CM | POA: Diagnosis not present

## 2021-11-26 DIAGNOSIS — H01001 Unspecified blepharitis right upper eyelid: Secondary | ICD-10-CM | POA: Diagnosis not present

## 2021-12-17 DIAGNOSIS — Z20822 Contact with and (suspected) exposure to covid-19: Secondary | ICD-10-CM | POA: Diagnosis not present

## 2022-01-14 DIAGNOSIS — M810 Age-related osteoporosis without current pathological fracture: Secondary | ICD-10-CM | POA: Diagnosis not present

## 2022-01-14 DIAGNOSIS — R7303 Prediabetes: Secondary | ICD-10-CM | POA: Diagnosis not present

## 2022-01-14 DIAGNOSIS — Z136 Encounter for screening for cardiovascular disorders: Secondary | ICD-10-CM | POA: Diagnosis not present

## 2022-01-14 DIAGNOSIS — Z8639 Personal history of other endocrine, nutritional and metabolic disease: Secondary | ICD-10-CM | POA: Diagnosis not present

## 2022-01-14 DIAGNOSIS — R801 Persistent proteinuria, unspecified: Secondary | ICD-10-CM | POA: Diagnosis not present

## 2022-01-14 DIAGNOSIS — Z13220 Encounter for screening for lipoid disorders: Secondary | ICD-10-CM | POA: Diagnosis not present

## 2022-01-17 DIAGNOSIS — M810 Age-related osteoporosis without current pathological fracture: Secondary | ICD-10-CM | POA: Diagnosis not present

## 2022-01-17 DIAGNOSIS — F5101 Primary insomnia: Secondary | ICD-10-CM | POA: Diagnosis not present

## 2022-01-17 DIAGNOSIS — Z23 Encounter for immunization: Secondary | ICD-10-CM | POA: Diagnosis not present

## 2022-01-17 DIAGNOSIS — M1812 Unilateral primary osteoarthritis of first carpometacarpal joint, left hand: Secondary | ICD-10-CM | POA: Diagnosis not present

## 2022-01-17 DIAGNOSIS — I10 Essential (primary) hypertension: Secondary | ICD-10-CM | POA: Diagnosis not present

## 2022-01-17 DIAGNOSIS — F331 Major depressive disorder, recurrent, moderate: Secondary | ICD-10-CM | POA: Diagnosis not present

## 2022-01-17 DIAGNOSIS — M7130 Other bursal cyst, unspecified site: Secondary | ICD-10-CM | POA: Diagnosis not present

## 2022-01-17 DIAGNOSIS — R7309 Other abnormal glucose: Secondary | ICD-10-CM | POA: Diagnosis not present

## 2022-01-17 DIAGNOSIS — L299 Pruritus, unspecified: Secondary | ICD-10-CM | POA: Diagnosis not present

## 2022-01-17 DIAGNOSIS — Z0000 Encounter for general adult medical examination without abnormal findings: Secondary | ICD-10-CM | POA: Diagnosis not present

## 2022-01-17 DIAGNOSIS — L309 Dermatitis, unspecified: Secondary | ICD-10-CM | POA: Diagnosis not present

## 2022-01-17 DIAGNOSIS — J309 Allergic rhinitis, unspecified: Secondary | ICD-10-CM | POA: Diagnosis not present

## 2022-01-22 ENCOUNTER — Other Ambulatory Visit: Payer: Self-pay | Admitting: Family Medicine

## 2022-01-22 DIAGNOSIS — M810 Age-related osteoporosis without current pathological fracture: Secondary | ICD-10-CM

## 2022-02-05 ENCOUNTER — Other Ambulatory Visit: Payer: Self-pay | Admitting: Family Medicine

## 2022-02-05 DIAGNOSIS — Z1231 Encounter for screening mammogram for malignant neoplasm of breast: Secondary | ICD-10-CM

## 2022-05-28 DIAGNOSIS — Z23 Encounter for immunization: Secondary | ICD-10-CM | POA: Diagnosis not present

## 2022-06-03 DIAGNOSIS — Z23 Encounter for immunization: Secondary | ICD-10-CM | POA: Diagnosis not present

## 2022-07-22 DIAGNOSIS — R7309 Other abnormal glucose: Secondary | ICD-10-CM | POA: Diagnosis not present

## 2022-07-24 DIAGNOSIS — Z681 Body mass index (BMI) 19 or less, adult: Secondary | ICD-10-CM | POA: Diagnosis not present

## 2022-07-24 DIAGNOSIS — G479 Sleep disorder, unspecified: Secondary | ICD-10-CM | POA: Diagnosis not present

## 2022-07-24 DIAGNOSIS — E063 Autoimmune thyroiditis: Secondary | ICD-10-CM | POA: Diagnosis not present

## 2022-07-24 DIAGNOSIS — R7303 Prediabetes: Secondary | ICD-10-CM | POA: Diagnosis not present

## 2022-07-24 DIAGNOSIS — Z13220 Encounter for screening for lipoid disorders: Secondary | ICD-10-CM | POA: Diagnosis not present

## 2022-07-24 DIAGNOSIS — E441 Mild protein-calorie malnutrition: Secondary | ICD-10-CM | POA: Diagnosis not present

## 2022-07-24 DIAGNOSIS — M810 Age-related osteoporosis without current pathological fracture: Secondary | ICD-10-CM | POA: Diagnosis not present

## 2022-07-24 DIAGNOSIS — I10 Essential (primary) hypertension: Secondary | ICD-10-CM | POA: Diagnosis not present

## 2022-07-24 DIAGNOSIS — R801 Persistent proteinuria, unspecified: Secondary | ICD-10-CM | POA: Diagnosis not present

## 2022-07-24 DIAGNOSIS — F3340 Major depressive disorder, recurrent, in remission, unspecified: Secondary | ICD-10-CM | POA: Diagnosis not present

## 2022-07-24 DIAGNOSIS — E038 Other specified hypothyroidism: Secondary | ICD-10-CM | POA: Diagnosis not present

## 2022-07-24 DIAGNOSIS — J069 Acute upper respiratory infection, unspecified: Secondary | ICD-10-CM | POA: Diagnosis not present

## 2022-08-08 DIAGNOSIS — M792 Neuralgia and neuritis, unspecified: Secondary | ICD-10-CM | POA: Diagnosis not present

## 2022-08-08 DIAGNOSIS — L600 Ingrowing nail: Secondary | ICD-10-CM | POA: Diagnosis not present

## 2022-08-08 DIAGNOSIS — S92535A Nondisplaced fracture of distal phalanx of left lesser toe(s), initial encounter for closed fracture: Secondary | ICD-10-CM | POA: Diagnosis not present

## 2022-08-23 DIAGNOSIS — H5203 Hypermetropia, bilateral: Secondary | ICD-10-CM | POA: Diagnosis not present

## 2022-08-23 DIAGNOSIS — H2513 Age-related nuclear cataract, bilateral: Secondary | ICD-10-CM | POA: Diagnosis not present

## 2022-09-05 DIAGNOSIS — S92515D Nondisplaced fracture of proximal phalanx of left lesser toe(s), subsequent encounter for fracture with routine healing: Secondary | ICD-10-CM | POA: Diagnosis not present

## 2022-09-05 DIAGNOSIS — L600 Ingrowing nail: Secondary | ICD-10-CM | POA: Diagnosis not present

## 2022-09-05 DIAGNOSIS — M792 Neuralgia and neuritis, unspecified: Secondary | ICD-10-CM | POA: Diagnosis not present

## 2022-10-07 DIAGNOSIS — L600 Ingrowing nail: Secondary | ICD-10-CM | POA: Diagnosis not present

## 2022-10-07 DIAGNOSIS — M792 Neuralgia and neuritis, unspecified: Secondary | ICD-10-CM | POA: Diagnosis not present

## 2022-10-07 DIAGNOSIS — S92515D Nondisplaced fracture of proximal phalanx of left lesser toe(s), subsequent encounter for fracture with routine healing: Secondary | ICD-10-CM | POA: Diagnosis not present

## 2022-10-24 ENCOUNTER — Ambulatory Visit
Admission: RE | Admit: 2022-10-24 | Discharge: 2022-10-24 | Disposition: A | Discharge status: Home / Self Care | Payer: Medicare Other | Source: Ambulatory Visit | Attending: Family Medicine | Admitting: Family Medicine

## 2022-10-24 DIAGNOSIS — M810 Age-related osteoporosis without current pathological fracture: Secondary | ICD-10-CM

## 2022-10-24 DIAGNOSIS — Z1231 Encounter for screening mammogram for malignant neoplasm of breast: Secondary | ICD-10-CM

## 2022-10-24 DIAGNOSIS — Z780 Asymptomatic menopausal state: Secondary | ICD-10-CM | POA: Diagnosis not present

## 2022-10-24 DIAGNOSIS — M8589 Other specified disorders of bone density and structure, multiple sites: Secondary | ICD-10-CM | POA: Diagnosis not present

## 2022-11-22 DIAGNOSIS — M792 Neuralgia and neuritis, unspecified: Secondary | ICD-10-CM | POA: Diagnosis not present

## 2022-11-22 DIAGNOSIS — L600 Ingrowing nail: Secondary | ICD-10-CM | POA: Diagnosis not present

## 2022-11-22 DIAGNOSIS — S92515D Nondisplaced fracture of proximal phalanx of left lesser toe(s), subsequent encounter for fracture with routine healing: Secondary | ICD-10-CM | POA: Diagnosis not present

## 2023-01-17 DIAGNOSIS — R7309 Other abnormal glucose: Secondary | ICD-10-CM | POA: Diagnosis not present

## 2023-01-17 DIAGNOSIS — R801 Persistent proteinuria, unspecified: Secondary | ICD-10-CM | POA: Diagnosis not present

## 2023-01-17 DIAGNOSIS — E063 Autoimmune thyroiditis: Secondary | ICD-10-CM | POA: Diagnosis not present

## 2023-01-17 DIAGNOSIS — Z136 Encounter for screening for cardiovascular disorders: Secondary | ICD-10-CM | POA: Diagnosis not present

## 2023-01-17 DIAGNOSIS — R7303 Prediabetes: Secondary | ICD-10-CM | POA: Diagnosis not present

## 2023-01-17 DIAGNOSIS — T1502XA Foreign body in cornea, left eye, initial encounter: Secondary | ICD-10-CM | POA: Diagnosis not present

## 2023-01-17 DIAGNOSIS — M810 Age-related osteoporosis without current pathological fracture: Secondary | ICD-10-CM | POA: Diagnosis not present

## 2023-01-17 DIAGNOSIS — Z13220 Encounter for screening for lipoid disorders: Secondary | ICD-10-CM | POA: Diagnosis not present

## 2023-01-29 DIAGNOSIS — F5101 Primary insomnia: Secondary | ICD-10-CM | POA: Diagnosis not present

## 2023-01-29 DIAGNOSIS — Z681 Body mass index (BMI) 19 or less, adult: Secondary | ICD-10-CM | POA: Diagnosis not present

## 2023-01-29 DIAGNOSIS — I10 Essential (primary) hypertension: Secondary | ICD-10-CM | POA: Diagnosis not present

## 2023-01-29 DIAGNOSIS — F419 Anxiety disorder, unspecified: Secondary | ICD-10-CM | POA: Diagnosis not present

## 2023-01-29 DIAGNOSIS — M810 Age-related osteoporosis without current pathological fracture: Secondary | ICD-10-CM | POA: Diagnosis not present

## 2023-01-29 DIAGNOSIS — Z0000 Encounter for general adult medical examination without abnormal findings: Secondary | ICD-10-CM | POA: Diagnosis not present

## 2023-01-29 DIAGNOSIS — L309 Dermatitis, unspecified: Secondary | ICD-10-CM | POA: Diagnosis not present

## 2023-01-29 DIAGNOSIS — F331 Major depressive disorder, recurrent, moderate: Secondary | ICD-10-CM | POA: Diagnosis not present

## 2023-05-02 DIAGNOSIS — L308 Other specified dermatitis: Secondary | ICD-10-CM | POA: Diagnosis not present

## 2023-05-28 DIAGNOSIS — M792 Neuralgia and neuritis, unspecified: Secondary | ICD-10-CM | POA: Diagnosis not present

## 2023-05-28 DIAGNOSIS — S92501A Displaced unspecified fracture of right lesser toe(s), initial encounter for closed fracture: Secondary | ICD-10-CM | POA: Diagnosis not present

## 2023-06-18 DIAGNOSIS — S92911A Unspecified fracture of right toe(s), initial encounter for closed fracture: Secondary | ICD-10-CM | POA: Diagnosis not present

## 2023-06-18 DIAGNOSIS — M792 Neuralgia and neuritis, unspecified: Secondary | ICD-10-CM | POA: Diagnosis not present

## 2023-06-18 DIAGNOSIS — S92501A Displaced unspecified fracture of right lesser toe(s), initial encounter for closed fracture: Secondary | ICD-10-CM | POA: Diagnosis not present

## 2023-06-18 DIAGNOSIS — Z23 Encounter for immunization: Secondary | ICD-10-CM | POA: Diagnosis not present

## 2023-06-23 DIAGNOSIS — Z23 Encounter for immunization: Secondary | ICD-10-CM | POA: Diagnosis not present

## 2023-07-09 DIAGNOSIS — S92501A Displaced unspecified fracture of right lesser toe(s), initial encounter for closed fracture: Secondary | ICD-10-CM | POA: Diagnosis not present

## 2023-07-09 DIAGNOSIS — M792 Neuralgia and neuritis, unspecified: Secondary | ICD-10-CM | POA: Diagnosis not present

## 2023-07-09 DIAGNOSIS — S92911A Unspecified fracture of right toe(s), initial encounter for closed fracture: Secondary | ICD-10-CM | POA: Diagnosis not present

## 2023-07-31 DIAGNOSIS — Z681 Body mass index (BMI) 19 or less, adult: Secondary | ICD-10-CM | POA: Diagnosis not present

## 2023-07-31 DIAGNOSIS — M810 Age-related osteoporosis without current pathological fracture: Secondary | ICD-10-CM | POA: Diagnosis not present

## 2023-07-31 DIAGNOSIS — F419 Anxiety disorder, unspecified: Secondary | ICD-10-CM | POA: Diagnosis not present

## 2023-07-31 DIAGNOSIS — I10 Essential (primary) hypertension: Secondary | ICD-10-CM | POA: Diagnosis not present

## 2023-07-31 DIAGNOSIS — J309 Allergic rhinitis, unspecified: Secondary | ICD-10-CM | POA: Diagnosis not present

## 2023-07-31 DIAGNOSIS — R7303 Prediabetes: Secondary | ICD-10-CM | POA: Diagnosis not present

## 2023-07-31 DIAGNOSIS — Z636 Dependent relative needing care at home: Secondary | ICD-10-CM | POA: Diagnosis not present

## 2023-07-31 DIAGNOSIS — L309 Dermatitis, unspecified: Secondary | ICD-10-CM | POA: Diagnosis not present

## 2023-07-31 DIAGNOSIS — F5101 Primary insomnia: Secondary | ICD-10-CM | POA: Diagnosis not present

## 2023-09-05 ENCOUNTER — Other Ambulatory Visit: Payer: Self-pay | Admitting: Family Medicine

## 2023-09-05 DIAGNOSIS — Z1231 Encounter for screening mammogram for malignant neoplasm of breast: Secondary | ICD-10-CM

## 2023-09-30 DIAGNOSIS — H25813 Combined forms of age-related cataract, bilateral: Secondary | ICD-10-CM | POA: Diagnosis not present

## 2023-09-30 DIAGNOSIS — H10413 Chronic giant papillary conjunctivitis, bilateral: Secondary | ICD-10-CM | POA: Diagnosis not present

## 2023-09-30 DIAGNOSIS — H04123 Dry eye syndrome of bilateral lacrimal glands: Secondary | ICD-10-CM | POA: Diagnosis not present

## 2023-09-30 DIAGNOSIS — H5203 Hypermetropia, bilateral: Secondary | ICD-10-CM | POA: Diagnosis not present

## 2023-10-27 ENCOUNTER — Ambulatory Visit
Admission: RE | Admit: 2023-10-27 | Discharge: 2023-10-27 | Disposition: A | Discharge status: Home / Self Care | Payer: Medicare Other | Source: Ambulatory Visit | Attending: Family Medicine | Admitting: Family Medicine

## 2023-10-27 DIAGNOSIS — Z1231 Encounter for screening mammogram for malignant neoplasm of breast: Secondary | ICD-10-CM | POA: Diagnosis not present

## 2023-10-27 HISTORY — DX: Personal history of antineoplastic chemotherapy: Z92.21

## 2023-10-27 HISTORY — DX: Personal history of irradiation: Z92.3

## 2023-11-27 DIAGNOSIS — I10 Essential (primary) hypertension: Secondary | ICD-10-CM | POA: Diagnosis not present

## 2023-12-24 DIAGNOSIS — I10 Essential (primary) hypertension: Secondary | ICD-10-CM | POA: Diagnosis not present

## 2023-12-24 DIAGNOSIS — F331 Major depressive disorder, recurrent, moderate: Secondary | ICD-10-CM | POA: Diagnosis not present

## 2023-12-24 DIAGNOSIS — M810 Age-related osteoporosis without current pathological fracture: Secondary | ICD-10-CM | POA: Diagnosis not present

## 2023-12-26 DIAGNOSIS — I10 Essential (primary) hypertension: Secondary | ICD-10-CM | POA: Diagnosis not present

## 2024-01-24 DIAGNOSIS — M810 Age-related osteoporosis without current pathological fracture: Secondary | ICD-10-CM | POA: Diagnosis not present

## 2024-01-24 DIAGNOSIS — F331 Major depressive disorder, recurrent, moderate: Secondary | ICD-10-CM | POA: Diagnosis not present

## 2024-01-24 DIAGNOSIS — I10 Essential (primary) hypertension: Secondary | ICD-10-CM | POA: Diagnosis not present

## 2024-01-25 DIAGNOSIS — I10 Essential (primary) hypertension: Secondary | ICD-10-CM | POA: Diagnosis not present

## 2024-02-02 DIAGNOSIS — I10 Essential (primary) hypertension: Secondary | ICD-10-CM | POA: Diagnosis not present

## 2024-02-02 DIAGNOSIS — M810 Age-related osteoporosis without current pathological fracture: Secondary | ICD-10-CM | POA: Diagnosis not present

## 2024-02-02 DIAGNOSIS — R7303 Prediabetes: Secondary | ICD-10-CM | POA: Diagnosis not present

## 2024-02-05 DIAGNOSIS — F419 Anxiety disorder, unspecified: Secondary | ICD-10-CM | POA: Diagnosis not present

## 2024-02-05 DIAGNOSIS — F5101 Primary insomnia: Secondary | ICD-10-CM | POA: Diagnosis not present

## 2024-02-05 DIAGNOSIS — L309 Dermatitis, unspecified: Secondary | ICD-10-CM | POA: Diagnosis not present

## 2024-02-05 DIAGNOSIS — Z681 Body mass index (BMI) 19 or less, adult: Secondary | ICD-10-CM | POA: Diagnosis not present

## 2024-02-05 DIAGNOSIS — Z853 Personal history of malignant neoplasm of breast: Secondary | ICD-10-CM | POA: Diagnosis not present

## 2024-02-05 DIAGNOSIS — M542 Cervicalgia: Secondary | ICD-10-CM | POA: Diagnosis not present

## 2024-02-05 DIAGNOSIS — F331 Major depressive disorder, recurrent, moderate: Secondary | ICD-10-CM | POA: Diagnosis not present

## 2024-02-05 DIAGNOSIS — Z23 Encounter for immunization: Secondary | ICD-10-CM | POA: Diagnosis not present

## 2024-02-05 DIAGNOSIS — J309 Allergic rhinitis, unspecified: Secondary | ICD-10-CM | POA: Diagnosis not present

## 2024-02-05 DIAGNOSIS — M810 Age-related osteoporosis without current pathological fracture: Secondary | ICD-10-CM | POA: Diagnosis not present

## 2024-02-05 DIAGNOSIS — Z0000 Encounter for general adult medical examination without abnormal findings: Secondary | ICD-10-CM | POA: Diagnosis not present

## 2024-02-05 DIAGNOSIS — I10 Essential (primary) hypertension: Secondary | ICD-10-CM | POA: Diagnosis not present

## 2024-02-20 DIAGNOSIS — T148XXA Other injury of unspecified body region, initial encounter: Secondary | ICD-10-CM | POA: Diagnosis not present

## 2024-03-25 DIAGNOSIS — M810 Age-related osteoporosis without current pathological fracture: Secondary | ICD-10-CM | POA: Diagnosis not present

## 2024-03-25 DIAGNOSIS — F331 Major depressive disorder, recurrent, moderate: Secondary | ICD-10-CM | POA: Diagnosis not present

## 2024-03-25 DIAGNOSIS — I10 Essential (primary) hypertension: Secondary | ICD-10-CM | POA: Diagnosis not present

## 2024-04-24 DIAGNOSIS — I10 Essential (primary) hypertension: Secondary | ICD-10-CM | POA: Diagnosis not present

## 2024-04-25 DIAGNOSIS — I10 Essential (primary) hypertension: Secondary | ICD-10-CM | POA: Diagnosis not present

## 2024-04-25 DIAGNOSIS — F331 Major depressive disorder, recurrent, moderate: Secondary | ICD-10-CM | POA: Diagnosis not present

## 2024-04-25 DIAGNOSIS — M810 Age-related osteoporosis without current pathological fracture: Secondary | ICD-10-CM | POA: Diagnosis not present

## 2024-05-24 DIAGNOSIS — I10 Essential (primary) hypertension: Secondary | ICD-10-CM | POA: Diagnosis not present

## 2024-05-25 DIAGNOSIS — M810 Age-related osteoporosis without current pathological fracture: Secondary | ICD-10-CM | POA: Diagnosis not present

## 2024-05-25 DIAGNOSIS — I10 Essential (primary) hypertension: Secondary | ICD-10-CM | POA: Diagnosis not present

## 2024-05-25 DIAGNOSIS — F331 Major depressive disorder, recurrent, moderate: Secondary | ICD-10-CM | POA: Diagnosis not present

## 2024-05-29 DIAGNOSIS — Z23 Encounter for immunization: Secondary | ICD-10-CM | POA: Diagnosis not present

## 2024-06-16 DIAGNOSIS — Z23 Encounter for immunization: Secondary | ICD-10-CM | POA: Diagnosis not present

## 2024-06-23 DIAGNOSIS — I10 Essential (primary) hypertension: Secondary | ICD-10-CM | POA: Diagnosis not present

## 2024-06-25 DIAGNOSIS — M810 Age-related osteoporosis without current pathological fracture: Secondary | ICD-10-CM | POA: Diagnosis not present

## 2024-06-25 DIAGNOSIS — F331 Major depressive disorder, recurrent, moderate: Secondary | ICD-10-CM | POA: Diagnosis not present

## 2024-06-25 DIAGNOSIS — I10 Essential (primary) hypertension: Secondary | ICD-10-CM | POA: Diagnosis not present

## 2024-07-23 DIAGNOSIS — I10 Essential (primary) hypertension: Secondary | ICD-10-CM | POA: Diagnosis not present

## 2024-07-25 DIAGNOSIS — F331 Major depressive disorder, recurrent, moderate: Secondary | ICD-10-CM | POA: Diagnosis not present

## 2024-07-25 DIAGNOSIS — I10 Essential (primary) hypertension: Secondary | ICD-10-CM | POA: Diagnosis not present

## 2024-07-25 DIAGNOSIS — M810 Age-related osteoporosis without current pathological fracture: Secondary | ICD-10-CM | POA: Diagnosis not present
